# Patient Record
Sex: Female | Born: 1975 | Race: White | Hispanic: No | Marital: Married | Smoking: Never smoker
Health system: Southern US, Community
[De-identification: ages and names within clinical notes are randomized; demographics above are authoritative.]

## PROBLEM LIST (undated history)

## (undated) DIAGNOSIS — K589 Irritable bowel syndrome without diarrhea: Secondary | ICD-10-CM

## (undated) DIAGNOSIS — M1990 Unspecified osteoarthritis, unspecified site: Secondary | ICD-10-CM

## (undated) DIAGNOSIS — K219 Gastro-esophageal reflux disease without esophagitis: Secondary | ICD-10-CM

## (undated) HISTORY — PX: ABDOMINAL HYSTERECTOMY: SHX81

## (undated) HISTORY — PX: TUBAL LIGATION: SHX77

## (undated) HISTORY — PX: PARTIAL HYSTERECTOMY: SHX80

---

## 2004-12-20 ENCOUNTER — Ambulatory Visit: Payer: Self-pay | Admitting: Obstetrics and Gynecology

## 2005-02-03 ENCOUNTER — Inpatient Hospital Stay: Payer: Self-pay

## 2006-11-19 ENCOUNTER — Observation Stay: Payer: Self-pay

## 2006-11-19 ENCOUNTER — Inpatient Hospital Stay: Payer: Self-pay | Admitting: Obstetrics and Gynecology

## 2007-04-04 ENCOUNTER — Ambulatory Visit: Payer: Self-pay

## 2007-07-18 ENCOUNTER — Ambulatory Visit: Payer: Self-pay | Admitting: Internal Medicine

## 2007-08-09 ENCOUNTER — Ambulatory Visit: Payer: Self-pay

## 2007-12-23 ENCOUNTER — Ambulatory Visit: Payer: Self-pay | Admitting: Internal Medicine

## 2008-01-10 ENCOUNTER — Ambulatory Visit: Payer: Self-pay | Admitting: Internal Medicine

## 2009-01-12 ENCOUNTER — Ambulatory Visit: Payer: Self-pay | Admitting: Internal Medicine

## 2009-01-17 ENCOUNTER — Ambulatory Visit: Payer: Self-pay | Admitting: Internal Medicine

## 2009-02-15 ENCOUNTER — Ambulatory Visit: Payer: Self-pay | Admitting: Internal Medicine

## 2009-11-11 ENCOUNTER — Ambulatory Visit: Payer: Self-pay | Admitting: Family Medicine

## 2010-10-22 ENCOUNTER — Ambulatory Visit: Payer: Self-pay | Admitting: Internal Medicine

## 2010-12-08 ENCOUNTER — Ambulatory Visit: Payer: Self-pay | Admitting: Unknown Physician Specialty

## 2010-12-15 ENCOUNTER — Ambulatory Visit: Payer: Self-pay | Admitting: Unknown Physician Specialty

## 2010-12-16 LAB — PATHOLOGY REPORT

## 2011-12-26 ENCOUNTER — Emergency Department: Payer: Self-pay | Admitting: *Deleted

## 2012-04-22 ENCOUNTER — Ambulatory Visit: Payer: Self-pay | Admitting: Emergency Medicine

## 2012-06-11 ENCOUNTER — Ambulatory Visit: Payer: Self-pay | Admitting: Family Medicine

## 2013-01-08 ENCOUNTER — Ambulatory Visit: Payer: Self-pay | Admitting: Family Medicine

## 2015-04-04 ENCOUNTER — Ambulatory Visit
Admission: EM | Admit: 2015-04-04 | Discharge: 2015-04-04 | Disposition: A | Discharge status: Home / Self Care | Payer: BLUE CROSS/BLUE SHIELD | Attending: Internal Medicine | Admitting: Internal Medicine

## 2015-04-04 ENCOUNTER — Ambulatory Visit: Payer: BLUE CROSS/BLUE SHIELD

## 2015-04-04 DIAGNOSIS — R0789 Other chest pain: Secondary | ICD-10-CM | POA: Diagnosis not present

## 2015-04-04 DIAGNOSIS — K219 Gastro-esophageal reflux disease without esophagitis: Secondary | ICD-10-CM | POA: Diagnosis not present

## 2015-04-04 DIAGNOSIS — Z8249 Family history of ischemic heart disease and other diseases of the circulatory system: Secondary | ICD-10-CM

## 2015-04-04 DIAGNOSIS — R42 Dizziness and giddiness: Secondary | ICD-10-CM | POA: Insufficient documentation

## 2015-04-04 DIAGNOSIS — M069 Rheumatoid arthritis, unspecified: Secondary | ICD-10-CM | POA: Diagnosis not present

## 2015-04-04 DIAGNOSIS — R200 Anesthesia of skin: Secondary | ICD-10-CM | POA: Insufficient documentation

## 2015-04-04 DIAGNOSIS — R079 Chest pain, unspecified: Secondary | ICD-10-CM | POA: Insufficient documentation

## 2015-04-04 HISTORY — DX: Gastro-esophageal reflux disease without esophagitis: K21.9

## 2015-04-04 HISTORY — DX: Unspecified osteoarthritis, unspecified site: M19.90

## 2015-04-04 MED ORDER — GI COCKTAIL ~~LOC~~
30.0000 mL | Freq: Once | ORAL | Status: AC
  Administered 2015-04-04: 30 mL via ORAL

## 2015-04-04 MED ORDER — ASPIRIN 81 MG PO CHEW
243.0000 mg | CHEWABLE_TABLET | Freq: Once | ORAL | Status: AC
  Administered 2015-04-04: 243 mg via ORAL

## 2015-04-04 MED ORDER — ISOSORBIDE MONONITRATE ER 30 MG PO TB24: 30.0000 mg | ORAL_TABLET | Freq: Every day | ORAL | Status: DC

## 2015-04-04 MED ORDER — METOPROLOL TARTRATE 25 MG PO TABS: 25.0000 mg | ORAL_TABLET | Freq: Two times a day (BID) | ORAL | Status: DC

## 2015-04-04 NOTE — ED Notes (Signed)
Vital signs stable. 

## 2015-04-04 NOTE — Discharge Instructions (Signed)
Your chest xray was unremarkable today. Your ECGs changed during your time at the urgent care, which can be a sign of a coronary blockage.  The fact that your arm and chest symptoms have subsided for now is encouraging. Prescriptions for metoprolol and isosorbide, medicines that are good for the heart, were sent to [facility].  You should take a dose of each tonight, and again in the morning. Also take a baby aspirin daily, starting tomorrow.  You have had a total of 4 baby aspirins today. Dr Gwen Pounds, a [facility] cardiologist, will see you tomorrow Monday 7/18 at 3:30pm in his [facility] office. Take copies of ECGs with you to the appointment.  Please call the [facility] office/answering service at [phone] with questions in the meantime. If chest/arm symptoms return and last longer than about 5 minutes, particularly if severe, you need to go to the emergency room.

## 2015-04-04 NOTE — ED Provider Notes (Signed)
CSN: [id number]     Arrival date & time 04/04/15  1145 History   First MD Initiated Contact with Patient 04/04/15 1318     Chief Complaint  Patient presents with  . Dizziness  . Numbness   HPI   Patient is a 39 year old lady with past medical history notable for rheumatoid arthritis, followed by Dr. Lavenia Atlas.  She presents today with about a 10 day history of intermittent dizziness which she describes as episodes of swimmy headedness the last for about a minute. In the last couple of days she's had intermittent episodes of sharp chest pain and tingling discomfort in her left arm and hand. These are sometimes, but not exclusively, exertional.  Symptoms are most likely to occur while she is at work, and her work is physical. 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 She went to the [facility] ER with symptoms last evening, and waited in the waiting room for several hours. Family history is notable for strokes in her father and sister, and heart disease in her sister. Sister is age 64 and recently had a heart attack; reported findings at cardiac cath were that one vessel was occluded but too small for stenting.  The sister's stroke and heart attack occurred close in time to each other.  Father had a stroke at age 56.  Past Medical History  Diagnosis Date  . GERD (gastroesophageal reflux disease)   . Arthritis, rheumatoid      Past Surgical History  Procedure Laterality Date  . Partial hysterectomy, still has ovaries.     History reviewed. Family history positive for early onset cardiovascular disease (stroke, heart attack, in father and sister) History  Substance Use Topics  . Smoking status: Never Smoker   . Smokeless tobacco: Never Used  . Alcohol Use: No    Review of Systems  Allergies  Penicillins  Home Medications   Prior to Admission medications   Medication Sig Start Date End Date Taking? Authorizing Provider  folic acid (FOLVITE) 1  MG tablet Take 1 mg by mouth daily.   Yes Historical Provider, MD  methotrexate (RHEUMATREX) 2.5 MG tablet Take 2.5 mg by mouth 3 (three) times a week.   Yes Historical Provider, MD  ranitidine (ZANTAC) 150 MG tablet Take 150 mg by mouth 2 (two) times daily.   Yes Historical Provider, MD                 BP 110/68 mmHg  Pulse 67  Ht 5' (1.524 m)  Wt 115 lb (52.164 kg)  BMI 22.46 kg/m2  SpO2 100% Physical Exam  Constitutional: She is oriented to person, place, and time. No distress.  Alert, nicely groomed  HENT:  Head: Atraumatic.  Eyes:  Conjugate gaze, no eye redness/drainage  Neck: Neck supple.  Cardiovascular: Normal rate and regular rhythm.   Pulmonary/Chest: No respiratory distress. She has no wheezes. She has no rales.  Lungs clear, symmetric breath sounds  Abdominal: She exhibits no distension. There is no tenderness. There is no rebound and no guarding.  Musculoskeletal: Normal range of motion.  No leg swelling  Neurological: She is alert and oriented to person, place, and time.  Skin: Skin is warm and dry.  No cyanosis  Nursing note and vitals reviewed.   ED Course  Procedures  Labs Review  Reviewed [facility] Epic labs for patient, done recently by Dr Lavenia Atlas.  Blood counts, renal and liver functions unremarkable.  Did not repeat labs today.  ECG#1 at urgent care:  sinus rhythm, rate 74, qrs duration 88 msec, qrs axis 51, nonspecific T wave abnormality, biphasic/inverted T waves in V3-V6.  ECG#2 at urgent care (about 60-90 minutes later):   Sinus rhythm, rate 62, qrs duration 90 msec, qrs axis 58, upgoing T waves now in V3-V6.    Imaging Review Dg Chest 2 View  04/04/2015   CLINICAL DATA:  Intermittent chest pain and numbness in the left arm and hand. Dizziness for 2 days. Some nausea yesterday.  EXAM: CHEST  2 VIEW  COMPARISON:  None.  FINDINGS: The heart size and mediastinal contours are within normal limits. Both lungs are clear. The visualized skeletal  structures are unremarkable.  IMPRESSION: No active cardiopulmonary disease.   Electronically Signed   By: Norva Pavlov M.D.   On: 04/04/2015 13:02   Patient pain free while at urgent care. Discussed situation with Dr Kowalski/cardiology, who will see her in followup tomorrow at [facility] [address] office at 3:30pm.  Pt to go to ED for recurrent/severe chest/arm pain lasting longer than about 5 minutes.  MDM   1. Chest pain of uncertain etiology   2. Family history of premature coronary artery disease   Rx lopressor, isosorbide sent to [facility].  Pt to take aspirin 81mg  daily, and followup as above.    , MD 04/04/15 1728

## 2015-04-04 NOTE — ED Notes (Signed)
Patient denies pain and is resting comfortably.  

## 2015-04-04 NOTE — ED Notes (Signed)
Patient with dizziness for that past week. Then developed left arm numbness at around 600pm last night. No other symptoms associated. Numbness still persists.

## 2015-04-11 ENCOUNTER — Emergency Department
Admission: EM | Admit: 2015-04-11 | Discharge: 2015-04-11 | Disposition: A | Discharge status: Home / Self Care | Payer: BLUE CROSS/BLUE SHIELD | Attending: Emergency Medicine | Admitting: Emergency Medicine

## 2015-04-11 ENCOUNTER — Encounter: Payer: Self-pay | Admitting: *Deleted

## 2015-04-11 ENCOUNTER — Emergency Department: Payer: BLUE CROSS/BLUE SHIELD

## 2015-04-11 ENCOUNTER — Other Ambulatory Visit: Payer: Self-pay

## 2015-04-11 DIAGNOSIS — Z7982 Long term (current) use of aspirin: Secondary | ICD-10-CM | POA: Diagnosis not present

## 2015-04-11 DIAGNOSIS — Z880 Allergy status to penicillin: Secondary | ICD-10-CM | POA: Insufficient documentation

## 2015-04-11 DIAGNOSIS — Z79899 Other long term (current) drug therapy: Secondary | ICD-10-CM | POA: Insufficient documentation

## 2015-04-11 DIAGNOSIS — R079 Chest pain, unspecified: Secondary | ICD-10-CM | POA: Diagnosis present

## 2015-04-11 LAB — CBC
HEMATOCRIT: 40.6 % (ref 35.0–47.0)
Hemoglobin: 13.4 g/dL (ref 12.0–16.0)
MCH: 31.1 pg (ref 26.0–34.0)
MCHC: 33.1 g/dL (ref 32.0–36.0)
MCV: 93.9 fL (ref 80.0–100.0)
Platelets: 236 10*3/uL (ref 150–440)
RBC: 4.32 MIL/uL (ref 3.80–5.20)
RDW: 14.1 % (ref 11.5–14.5)
WBC: 7.3 10*3/uL (ref 3.6–11.0)

## 2015-04-11 LAB — BASIC METABOLIC PANEL
Anion gap: 7 (ref 5–15)
BUN: 10 mg/dL (ref 6–20)
CALCIUM: 9 mg/dL (ref 8.9–10.3)
CO2: 28 mmol/L (ref 22–32)
Chloride: 106 mmol/L (ref 101–111)
Creatinine, Ser: 0.73 mg/dL (ref 0.44–1.00)
GFR calc Af Amer: >60 mL/min (ref >60)
Glucose, Bld: 117 mg/dL — ABNORMAL HIGH (ref 65–99)
Potassium: 3.6 mmol/L (ref 3.5–5.1)
SODIUM: 141 mmol/L (ref 135–145)

## 2015-04-11 LAB — TROPONIN I
Troponin I: <0.03 ng/mL (ref <0.031)
Troponin I: <0.03 ng/mL (ref <0.031)

## 2015-04-11 LAB — FIBRIN DERIVATIVES D-DIMER (ARMC ONLY): Fibrin derivatives D-dimer (ARMC): 441 (ref 0–499)

## 2015-04-11 MED ORDER — SODIUM CHLORIDE 0.9 % IV BOLUS (SEPSIS)
1000.0000 mL | Freq: Once | INTRAVENOUS | Status: AC
  Administered 2015-04-11: 1000 mL via INTRAVENOUS

## 2015-04-11 NOTE — Discharge Instructions (Signed)
No certain cause was found for your chest discomfort, however your exam and evaluation are reassuring. No exertional activity until your seen in follow-up by Dr. Gwen Pounds. Call tomorrow to find out if he wants to see Monday or Tuesday, and keep your appointment for Wednesday for the scheduled stress test. Return to the emergency room for any worsening condition including chest pain, dizziness, passing out, shortness of breath, fever, or any other symptoms concerning to.  Your blood pressure was mildly pneumonia came in and you're given a pack of IV fluids. Make sure that she were to keep plenty fluids as they don't dehydrated.   Chest Pain (Nonspecific) It is often hard to give a diagnosis for the cause of chest pain. There is always a chance that your pain could be related to something serious, such as a heart attack or a blood clot in the lungs. You need to follow up with your doctor. HOME CARE  If antibiotic medicine was given, take it as directed by your doctor. Finish the medicine even if you start to feel better.  For the next few days, avoid activities that bring on chest pain. Continue physical activities as told by your doctor.  Do not use any tobacco products. This includes cigarettes, chewing tobacco, and e-cigarettes.  Avoid drinking alcohol.  Only take medicine as told by your doctor.  Follow your doctor's suggestions for more testing if your chest pain does not go away.  Keep all doctor visits you made. GET HELP IF:  Your chest pain does not go away, even after treatment.  You have a rash with blisters on your chest.  You have a fever. GET HELP RIGHT AWAY IF:   You have more pain or pain that spreads to your arm, neck, jaw, back, or belly (abdomen).  You have shortness of breath.  You cough more than usual or cough up blood.  You have very bad back or belly pain.  You feel sick to your stomach (nauseous) or throw up (vomit).  You have very bad weakness.  You  pass out (faint).  You have chills. This is an emergency. Do not wait to see if the problems will go away. Call your local emergency services (911 in [place] Do not drive yourself to the hospital. MAKE SURE YOU:   Understand these instructions.  Will watch your condition.  Will get help right away if you are not doing well or get worse. Document Released: 02/21/2008 Document Revised: 09/09/2013 Document Reviewed: 02/21/2008 [facility] Patient Information 2015 [place], [place]. This information is not intended to replace advice given to you by your health care provider. Make sure you discuss any questions you have with your health care provider.

## 2015-04-11 NOTE — ED Provider Notes (Addendum)
[facility] Emergency Department Provider Note   ____________________________________________  Time seen: 12:30 PM I have reviewed the triage vital signs and the triage nursing note.  HISTORY  Chief Complaint Chest Pain   Historian Patient and her spouse  HPI Amy Kirby is a 39 y.o. female is complaining of an episode of chest pressure that felt like an elephant was on her chest and extended into numbness of the left arm without any weakness. Started around 10 AM. She felt little nauseated. She did have some right hand numbness as well. She was recently seen at urgent care for sore chest discomfort episode and referred to cardiologist and saw Dr. Gwen Pounds last week. She had a Holter monitor and turned in on Tuesday. She has not heard about the results. She has a stress test scheduled for this coming Wednesday. No shortness of breath. No fevers. No coughing. No indigestion or GI symptoms. Patient states she is under the "normal amount of stress."    Past Medical History  Diagnosis Date  . GERD (gastroesophageal reflux disease)   . Arthritis     There are no active problems to display for this patient.   Past Surgical History  Procedure Laterality Date  . Partial hysterectomy      Current Outpatient Rx  Name  Route  Sig  Dispense  Refill  . aspirin EC 81 MG tablet   Oral   Take 81 mg by mouth daily.         . Cyanocobalamin (VITAMIN B-12 PO)   Oral   Take 1 tablet by mouth daily.         . folic acid (FOLVITE) 1 MG tablet   Oral   Take 1 mg by mouth daily.         . isosorbide mononitrate (IMDUR) 30 MG 24 hr tablet   Oral   Take 1 tablet (30 mg total) by mouth daily.   5 tablet   0   . methotrexate (RHEUMATREX) 2.5 MG tablet   Oral   Take 15 mg by mouth once a week. Take on Wednesday.         . metoprolol (LOPRESSOR) 25 MG tablet   Oral   Take 1 tablet (25 mg total) by mouth 2 (two) times daily.   10 tablet   0   .  ranitidine (ZANTAC) 150 MG tablet   Oral   Take 150 mg by mouth daily.            Allergies Penicillins  History reviewed. No pertinent family history.  Father with a stroke and MI, in his mid 10s Sister with a stroke and "minor heart attack ", in her 30s.  Social History History  Substance Use Topics  . Smoking status: Never Smoker   . Smokeless tobacco: Never Used  . Alcohol Use: No    Review of Systems  Constitutional: Negative for fever. Eyes: Negative for visual changes. ENT: Negative for sore throat. Cardiovascular: See history of present illness Respiratory: Negative for shortness of breath. Gastrointestinal: Negative for abdominal pain, vomiting and diarrhea. Genitourinary: Negative for dysuria. Musculoskeletal: Negative for back pain. Skin: Negative for rash. Neurological: Negative for headaches, focal weakness or numbness. 10 point Review of Systems otherwise negative ____________________________________________   PHYSICAL EXAM:  VITAL SIGNS: ED Triage Vitals  Enc Vitals Group     BP 04/11/15 1218 96/62 mmHg     Pulse Rate 04/11/15 1217 92     Resp --  Temp 04/11/15 1217 98.7 F (37.1 C)     Temp Source 04/11/15 1217 Oral     SpO2 04/11/15 1218 99 %     Weight 04/11/15 1218 105 lb (47.628 kg)     Height 04/11/15 1218 5\' 5"  (1.651 m)     Head Cir --      Peak Flow --      Pain Score 04/11/15 1218 4     Pain Loc --      Pain Edu? --      Excl. in GC? --      Constitutional: Alert and oriented. Appears somewhat anxious or uncomfortable. In no distress. Eyes: Conjunctivae are normal. PERRL. Normal extraocular movements. ENT   Head: Normocephalic and atraumatic.   Nose: No congestion/rhinnorhea.   Mouth/Throat: Mucous membranes are moist.   Neck: No stridor. Cardiovascular/Chest: Normal rate, regular rhythm.  No murmurs, rubs, or gallops. Respiratory: Normal respiratory effort without tachypnea nor retractions. Breath sounds  are clear and equal bilaterally. No wheezes/rales/rhonchi. Gastrointestinal: Soft. No distention, no guarding, no rebound. Nontender . Thin  Genitourinary/rectal:Deferred Musculoskeletal: Nontender with normal range of motion in all extremities. No joint effusions.  No lower extremity tenderness nor edema. Neurologic:  Normal speech and language. No gross or focal neurologic deficits are appreciated. Skin:  Skin is warm, dry and intact. No rash noted. Psychiatric: Mood and affect are normal. Speech and behavior are normal. Patient exhibits appropriate insight and judgment.  ____________________________________________   EKG I, 04/13/15, MD, the attending physician have personally viewed and interpreted all ECGs.  Normal sinus rhythm. 85 bpm. Normal axis. Narrow QRS. Normal ST and T-wave. ____________________________________________  LABS (pertinent positives/negatives)  Metabolic panel without significant abnormality CBC within normal limits Troponin less than 0.03  ____________________________________________  RADIOLOGY All Xrays were viewed by me. Imaging interpreted by Radiologist.  Chest x-ray portable: Normal exam __________________________________________  PROCEDURES  Procedure(s) performed: None Critical Care performed: None  ____________________________________________   ED COURSE / ASSESSMENT AND PLAN  CONSULTATIONS: None  Pertinent labs & imaging results that were available during my care of the patient were reviewed by me and considered in my medical decision making (see chart for details).  Patient episode that lasted about 2 hours of an atypical chest pain without certain etiology, however her exam and evaluation are reassuring. I discussed with her checking a repeat troponin about 3 hours. If this is reassuring she can be discharged to follow-up with the cardiologist and stress test on Wednesday as scheduled.  After 1 L normal saline patient's blood  pressures 97/50. Patient is not having any significant chest pain but she does say that she gets a sharp chest pain 20 left side every once a while. She is low risk for PE and I not suspicious of this diagnosis, however I will evaluate with a d-dimer.   Patient care transferred to Dr. Thursday at shift change, 3 PM. Repeat Troponin pending. D-dimer pending   Patient / Family / Caregiver informed of clinical course, medical decision-making process, and agree with plan.   I discussed return precautions, follow-up instructions, and discharged instructions with patient and/or family.  ___________________________________________   FINAL CLINICAL IMPRESSION(S) / ED DIAGNOSES   Final diagnoses:  Chest pain, unspecified chest pain type    FOLLOW UP  Referred to: Dr. Fanny Bien, MD 04/11/15 1400  04/13/15, MD 04/11/15 [phone]

## 2015-04-11 NOTE — ED Provider Notes (Signed)
Troponin negative 2. Vital signs stable. D-dimer negative. Patient awake and alert. Provided discharge instructions and follow-up care plus return precautions.  Sharyn Creamer, MD 04/11/15 [phone]

## 2015-04-11 NOTE — ED Notes (Signed)
Pt at Urgent Care 1 week ago for CP, referred to [facility], had 24 hr halter monitor on.  Pt has not heard back from cardio, c/o chest pressure today, EMS gave 2 SL nitro sprays, pt took 81 mg ASA, EMS gave 243 mg more.   Per EMS, pt was hyperventilating upon their arrival, pt has since calmed down.  States 4/10 chest pressure at this time.  NSR on the monitor.

## 2015-09-13 ENCOUNTER — Ambulatory Visit
Admission: EM | Admit: 2015-09-13 | Discharge: 2015-09-13 | Disposition: A | Discharge status: Home / Self Care | Payer: BLUE CROSS/BLUE SHIELD | Attending: Family Medicine | Admitting: Family Medicine

## 2015-09-13 ENCOUNTER — Encounter: Payer: Self-pay | Admitting: Emergency Medicine

## 2015-09-13 DIAGNOSIS — A499 Bacterial infection, unspecified: Secondary | ICD-10-CM | POA: Diagnosis not present

## 2015-09-13 DIAGNOSIS — N760 Acute vaginitis: Secondary | ICD-10-CM | POA: Diagnosis not present

## 2015-09-13 DIAGNOSIS — N762 Acute vulvitis: Secondary | ICD-10-CM

## 2015-09-13 DIAGNOSIS — B9689 Other specified bacterial agents as the cause of diseases classified elsewhere: Secondary | ICD-10-CM

## 2015-09-13 LAB — URINALYSIS COMPLETE WITH MICROSCOPIC (ARMC ONLY)
BACTERIA UA: NONE SEEN
Bilirubin Urine: NEGATIVE
GLUCOSE, UA: NEGATIVE mg/dL
Ketones, ur: NEGATIVE mg/dL
Nitrite: NEGATIVE
PROTEIN: NEGATIVE mg/dL
Specific Gravity, Urine: 1.02 (ref 1.005–1.030)
pH: 6.5 (ref 5.0–8.0)

## 2015-09-13 LAB — WET PREP, GENITAL
SPERM: NONE SEEN
TRICH WET PREP: NONE SEEN
WBC WET PREP: NONE SEEN
YEAST WET PREP: NONE SEEN

## 2015-09-13 MED ORDER — SULFAMETHOXAZOLE-TRIMETHOPRIM 800-160 MG PO TABS: 1.0000 | ORAL_TABLET | Freq: Two times a day (BID) | ORAL | Status: DC

## 2015-09-13 MED ORDER — METRONIDAZOLE 500 MG PO TABS
500.0000 mg | ORAL_TABLET | Freq: Two times a day (BID) | ORAL | Status: DC
Start: 2015-09-13 — End: 2015-09-16

## 2015-09-13 NOTE — ED Notes (Signed)
Patient c/o burning when urinating for 4-5 days.

## 2015-09-13 NOTE — Discharge Instructions (Signed)
Take medication as prescribed. Use sits baths. Practice pelvic rest and no sexual activity until completion medication and follow-up.  Follow-up closely with your primary care physician this week. Return to urgent care as needed for fever, increased pain, increased swelling, drainage, new or worsening concerns.  Bacterial Vaginosis Bacterial vaginosis is a vaginal infection that occurs when the normal balance of bacteria in the vagina is disrupted. It results from an overgrowth of certain bacteria. This is the most common vaginal infection in women of childbearing age. Treatment is important to prevent complications, especially in pregnant women, as it can cause a premature delivery. CAUSES  Bacterial vaginosis is caused by an increase in harmful bacteria that are normally present in smaller amounts in the vagina. Several different kinds of bacteria can cause bacterial vaginosis. However, the reason that the condition develops is not fully understood. RISK FACTORS Certain activities or behaviors can put you at an increased risk of developing bacterial vaginosis, including:  Having a new sex partner or multiple sex partners.  Douching.  Using an intrauterine device (IUD) for contraception. Women do not get bacterial vaginosis from toilet seats, bedding, swimming pools, or contact with objects around them. SIGNS AND SYMPTOMS  Some women with bacterial vaginosis have no signs or symptoms. Common symptoms include:  Grey vaginal discharge.  A fishlike odor with discharge, especially after sexual intercourse.  Itching or burning of the vagina and vulva.  Burning or pain with urination. DIAGNOSIS  Your health care provider will take a medical history and examine the vagina for signs of bacterial vaginosis. A sample of vaginal fluid may be taken. Your health care provider will look at this sample under a microscope to check for bacteria and abnormal cells. A vaginal pH test may also be done.    TREATMENT  Bacterial vaginosis may be treated with antibiotic medicines. These may be given in the form of a pill or a vaginal cream. A second round of antibiotics may be prescribed if the condition comes back after treatment. Because bacterial vaginosis increases your risk for sexually transmitted diseases, getting treated can help reduce your risk for chlamydia, gonorrhea, HIV, and herpes. HOME CARE INSTRUCTIONS   Only take over-the-counter or prescription medicines as directed by your health care provider.  If antibiotic medicine was prescribed, take it as directed. Make sure you finish it even if you start to feel better.  Tell all sexual partners that you have a vaginal infection. They should see their health care provider and be treated if they have problems, such as a mild rash or itching.  During treatment, it is important that you follow these instructions:  Avoid sexual activity or use condoms correctly.  Do not douche.  Avoid alcohol as directed by your health care provider.  Avoid breastfeeding as directed by your health care provider. SEEK MEDICAL CARE IF:   Your symptoms are not improving after 3 days of treatment.  You have increased discharge or pain.  You have a fever. MAKE SURE YOU:   Understand these instructions.  Will watch your condition.  Will get help right away if you are not doing well or get worse. FOR MORE INFORMATION  Centers for Disease Control and Prevention, Division of STD Prevention: SolutionApps.[employer].za American Sexual Health Association (ASHA): www.ashastd.org    This information is not intended to replace advice given to you by your health care provider. Make sure you discuss any questions you have with your health care provider.   Document Released: 09/04/2005 Document Revised:  09/25/2014 Document Reviewed: 04/16/2013 Elsevier Interactive Patient Education [employer].

## 2015-09-13 NOTE — ED Provider Notes (Signed)
[facility]  ____________________________________________  Time seen: Approximately 1:20PM  I have reviewed the triage vital signs and the nursing notes.   HISTORY  Chief Complaint Dysuria   HPI Amy Kirby is a 39 y.o. female presents for the complaint of burning with urination for 4-5 days. Patient reports that the burning is not only present with urination but seems to be at all times as well. Patient states that the left side of her vagina seems to burn. Patient also reports that the last 2-3 days some intermittent vaginal discharge. States tends to be a whitish to yellowish color. Denies abdominal pain or fever. Denies urinary frequency or urgency. Patient reports that when she looks at the left labia it looks to be red and swollen. Patient states that over the last 2 days that same area where she said is red and swollen seems more irritated as it has been rubbing against her pants and seems to open the skin.  Denies other rash or lesions. Denies history of similar in the past.   patient reports that she is sexually active with 1 partner. Patient does report that her and her husband were separated for 3 months, and  reports that he did have sexual relations with another partner. Reports that they've been reconciled for the last 3 weeks. Denies concerns for sexual transmitted diseases.  Denies vaginal pain, abdominal pain, back pain, urinary frequency, urinary urgency, bowel changes. Reports partial hysterectomy. Denies any history of cold sores herpes or sexually transmitted diseases.      Past Medical History  Diagnosis Date  . GERD (gastroesophageal reflux disease)   . Arthritis     There are no active problems to display for this patient.   Past Surgical History  Procedure Laterality Date  . Partial hysterectomy    . Abdominal hysterectomy    . Tubal ligation      Current Outpatient Rx  Name  Route  Sig  Dispense  Refill  . aspirin EC 81 MG tablet  Oral   Take 81 mg by mouth daily.         . Cyanocobalamin (VITAMIN B-12 PO)   Oral   Take 1 tablet by mouth daily.         . folic acid (FOLVITE) 1 MG tablet   Oral   Take 1 mg by mouth daily.         . isosorbide mononitrate (IMDUR) 30 MG 24 hr tablet   Oral   Take 1 tablet (30 mg total) by mouth daily.   5 tablet   0   . metoprolol (LOPRESSOR) 25 MG tablet   Oral   Take 1 tablet (25 mg total) by mouth 2 (two) times daily.   10 tablet   0   .           . ranitidine (ZANTAC) 150 MG tablet   Oral   Take 150 mg by mouth daily.          .             Allergies Penicillins  History reviewed. No pertinent family history.  Social History Social History  Substance Use Topics  . Smoking status: Never Smoker   . Smokeless tobacco: Never Used  . Alcohol Use: No    Review of Systems Constitutional: No fever/chills Eyes: No visual changes. ENT: No sore throat. Cardiovascular: Denies chest pain. Respiratory: Denies shortness of breath. Gastrointestinal: No abdominal pain.  No nausea, no vomiting.  No  diarrhea.  No constipation. Genitourinary: Vaginal burning Musculoskeletal: Negative for back pain. Skin: Negative for rash. Neurological: Negative for headaches, focal weakness or numbness.  10-point ROS otherwise negative.  ____________________________________________   PHYSICAL EXAM:  VITAL SIGNS: ED Triage Vitals  Enc Vitals Group     BP 09/13/15 1214 96/51 mmHg     Pulse Rate 09/13/15 1214 85     Resp 09/13/15 1214 16     Temp 09/13/15 1214 98.9 F (37.2 C)     Temp Source 09/13/15 1214 Tympanic     SpO2 09/13/15 1214 100 %     Weight 09/13/15 1214 115 lb (52.164 kg)     Height 09/13/15 1214 5\' 5"  (1.651 m)     Head Cir --      Peak Flow --      Pain Score 09/13/15 1218 6     Pain Loc --      Pain Edu? --      Excl. in GC? --     Constitutional: Alert and oriented. Well appearing and in no acute distress. Eyes: Conjunctivae are normal.  PERRL. EOMI. Head: Atraumatic.  Nose: No congestion/rhinnorhea.  Mouth/Throat: Mucous membranes are moist.  Oropharynx non-erythematous. Neck: No stridor.  No cervical spine tenderness to palpation. Hematological/Lymphatic/Immunilogical: No cervical lymphadenopathy. Cardiovascular: Normal rate, regular rhythm. Grossly normal heart sounds.  Good peripheral circulation. Respiratory: Normal respiratory effort.  No retractions. Lungs CTAB. Gastrointestinal: Soft and nontender. No distention. Normal Bowel sounds.  No abdominal bruits. No CVA tenderness. Pelvic : Exam completed with [name] RN at bedside.  External: Left labia mild to moderate erythema with mild swelling, no induration, no fluctuance , no pointing abscess, superficial excoriated appearing lesions present , no ulcerations no exudative drainage,  No palpated abscess, mild tenderness to palpation, no other rash or lesion noted. Speculum: Mild active whitish discharge, cervix surgically absent. Bimanual: Nontender, cervix surgically absent, no adnexal tenderness.   Musculoskeletal: No lower or upper extremity tenderness nor edema.  No joint effusions. Bilateral pedal pulses equal and easily palpated.  Neurologic:  Normal speech and language. No gross focal neurologic deficits are appreciated. No gait instability. Skin:  Skin is warm, dry and intact. No rash noted. Psychiatric: Mood and affect are normal. Speech and behavior are normal.  ____________________________________________   LABS (all labs ordered are listed, but only abnormal results are displayed)  Labs Reviewed  WET PREP, GENITAL - Abnormal; Notable for the following:    Clue Cells Wet Prep HPF POC FEW (*)    All other components within normal limits  URINALYSIS COMPLETEWITH MICROSCOPIC ([facility] ONLY) - Abnormal; Notable for the following:    Hgb urine dipstick TRACE (*)    Leukocytes, UA TRACE (*)    Squamous Epithelial / LPF 6-30 (*)    All other components within  normal limits  URINE CULTURE  CHLAMYDIA/NGC RT PCR ([facility] ONLY)  HSV CULTURE AND TYPING  HSV(HERPES SIMPLEX VRS) I + II AB-IGG  HSV(HERPES SIMPLEX VRS) I + II AB-IGM    INITIAL IMPRESSION / ASSESSMENT AND PLAN / ED COURSE  Pertinent labs & imaging results that were available during my care of the patient were reviewed by me and considered in my medical decision making (see chart for details).  Very well-appearing patient. No acute distress. Presents for the complaints of vaginal burning and tenderness. Patient reports that this is gradual onset over the last 4-5 days with accompanying whitish vaginal discharge. Denies fevers, urinary changes or bowel changes. Denies abdominal pain.  Urinalysis negative for bacteria. Will culture.  With RN [name] at bedside pelvic exam was completed. Patient left labia with mild to moderate erythema and mild swelling without focal abscess, induration or fluctuance. Left external labia with slightly excoriated appearing superficial lesions noted in which patient states that this was due to rubbing against her jeans. Discussed with patient will evaluate for herpes. Herpes swab as well as labs obtained. Patient states that she was okay with testing for gonorrhea and chlamydia but states that she does not want other STD testing. Counseled regarding close follow-up with primary care physician as well as evaluation for STDs as patient again reports that she does not want testing for any other STDs at this time.  Wet prep also positive for few clue cells. Will treat patient Bactrim vaginosis with oral Flagyl as well as due to left labial erythema concerned for beginning cellulitis and will treat with oral Bactrim. Counseled patient regarding to call back to urgent care to follow-up with herpes swab and lab testing. Encouraged close follow-up.  Discussed follow up with Primary care physician this week. Discussed follow up and return parameters including no  resolution or any worsening concerns. Patient verbalized understanding and agreed to plan.   ____________________________________________   FINAL CLINICAL IMPRESSION(S) / ED DIAGNOSES  Final diagnoses:  Bacterial vaginitis  Cellulitis of labia       Renford Dills, NP 09/16/15 1209

## 2015-09-14 LAB — CHLAMYDIA/NGC RT PCR (ARMC ONLY)
Chlamydia Tr: NOT DETECTED
N gonorrhoeae: NOT DETECTED

## 2015-09-15 LAB — HSV(HERPES SIMPLEX VRS) I + II AB-IGG: HSV 1 Glycoprotein G Ab, IgG: <0.91 index (ref 0.00–0.90)

## 2015-09-15 LAB — URINE CULTURE: Special Requests: NORMAL

## 2015-09-15 LAB — HSV(HERPES SIMPLEX VRS) I + II AB-IGM: HSVI/II Comb IgM: <0.91 Ratio (ref 0.00–0.90)

## 2015-09-16 ENCOUNTER — Encounter: Payer: Self-pay | Admitting: *Deleted

## 2015-09-16 ENCOUNTER — Inpatient Hospital Stay
Admission: EM | Admit: 2015-09-16 | Discharge: 2015-09-18 | DRG: 759 | Disposition: A | Discharge status: Home / Self Care | Payer: BLUE CROSS/BLUE SHIELD | Attending: Obstetrics and Gynecology | Admitting: Obstetrics and Gynecology

## 2015-09-16 ENCOUNTER — Other Ambulatory Visit: Payer: Self-pay | Admitting: Obstetrics and Gynecology

## 2015-09-16 DIAGNOSIS — R112 Nausea with vomiting, unspecified: Secondary | ICD-10-CM | POA: Diagnosis present

## 2015-09-16 DIAGNOSIS — N762 Acute vulvitis: Principal | ICD-10-CM | POA: Diagnosis present

## 2015-09-16 DIAGNOSIS — R519 Headache, unspecified: Secondary | ICD-10-CM

## 2015-09-16 DIAGNOSIS — R509 Fever, unspecified: Secondary | ICD-10-CM | POA: Diagnosis present

## 2015-09-16 DIAGNOSIS — K219 Gastro-esophageal reflux disease without esophagitis: Secondary | ICD-10-CM | POA: Diagnosis present

## 2015-09-16 DIAGNOSIS — Z880 Allergy status to penicillin: Secondary | ICD-10-CM

## 2015-09-16 DIAGNOSIS — L0390 Cellulitis, unspecified: Secondary | ICD-10-CM | POA: Diagnosis present

## 2015-09-16 DIAGNOSIS — R51 Headache: Secondary | ICD-10-CM | POA: Diagnosis present

## 2015-09-16 DIAGNOSIS — Z79899 Other long term (current) drug therapy: Secondary | ICD-10-CM

## 2015-09-16 DIAGNOSIS — Z7982 Long term (current) use of aspirin: Secondary | ICD-10-CM | POA: Diagnosis not present

## 2015-09-16 DIAGNOSIS — L03818 Cellulitis of other sites: Secondary | ICD-10-CM

## 2015-09-16 DIAGNOSIS — B9689 Other specified bacterial agents as the cause of diseases classified elsewhere: Secondary | ICD-10-CM

## 2015-09-16 DIAGNOSIS — N760 Acute vaginitis: Secondary | ICD-10-CM

## 2015-09-16 LAB — URINALYSIS COMPLETE WITH MICROSCOPIC (ARMC ONLY)
BILIRUBIN URINE: NEGATIVE
Bacteria, UA: NONE SEEN
Glucose, UA: NEGATIVE mg/dL
Hgb urine dipstick: NEGATIVE
NITRITE: NEGATIVE
PROTEIN: 30 mg/dL — AB
RBC / HPF: NONE SEEN RBC/hpf (ref 0–5)
Specific Gravity, Urine: 1.031 — ABNORMAL HIGH (ref 1.005–1.030)
pH: 5 (ref 5.0–8.0)

## 2015-09-16 LAB — COMPREHENSIVE METABOLIC PANEL
ALBUMIN: 4 g/dL (ref 3.5–5.0)
ALT: 16 U/L (ref 14–54)
ANION GAP: 9 (ref 5–15)
AST: 18 U/L (ref 15–41)
Alkaline Phosphatase: 77 U/L (ref 38–126)
BILIRUBIN TOTAL: 0.9 mg/dL (ref 0.3–1.2)
BUN: 13 mg/dL (ref 6–20)
CO2: 25 mmol/L (ref 22–32)
Calcium: 9 mg/dL (ref 8.9–10.3)
Chloride: 101 mmol/L (ref 101–111)
Creatinine, Ser: 0.63 mg/dL (ref 0.44–1.00)
GFR calc Af Amer: >60 mL/min (ref >60)
GFR calc non Af Amer: >60 mL/min (ref >60)
Glucose, Bld: 108 mg/dL — ABNORMAL HIGH (ref 65–99)
POTASSIUM: 4 mmol/L (ref 3.5–5.1)
Sodium: 135 mmol/L (ref 135–145)
TOTAL PROTEIN: 6.7 g/dL (ref 6.5–8.1)

## 2015-09-16 LAB — HSV CULTURE AND TYPING

## 2015-09-16 LAB — CBC WITH DIFFERENTIAL/PLATELET
BASOS PCT: 0 %
Basophils Absolute: 0 10*3/uL (ref 0–0.1)
EOS ABS: 0 10*3/uL (ref 0–0.7)
Eosinophils Relative: 0 %
HEMATOCRIT: 37.7 % (ref 35.0–47.0)
Hemoglobin: 12.8 g/dL (ref 12.0–16.0)
Lymphocytes Relative: 8 %
Lymphs Abs: 0.9 10*3/uL — ABNORMAL LOW (ref 1.0–3.6)
MCH: 31.4 pg (ref 26.0–34.0)
MCHC: 34 g/dL (ref 32.0–36.0)
MCV: 92.3 fL (ref 80.0–100.0)
MONO ABS: 0.4 10*3/uL (ref 0.2–0.9)
Monocytes Relative: 3 %
NEUTROS ABS: 9.3 10*3/uL — AB (ref 1.4–6.5)
Neutrophils Relative %: 89 %
Platelets: 272 10*3/uL (ref 150–440)
RBC: 4.08 MIL/uL (ref 3.80–5.20)
RDW: 13 % (ref 11.5–14.5)
WBC: 10.5 10*3/uL (ref 3.6–11.0)

## 2015-09-16 MED ORDER — ONDANSETRON HCL 4 MG/2ML IJ SOLN
4.0000 mg | Freq: Once | INTRAMUSCULAR | Status: AC
  Administered 2015-09-16: 4 mg via INTRAVENOUS

## 2015-09-16 MED ORDER — SULFAMETHOXAZOLE-TRIMETHOPRIM 800-160 MG PO TABS
1.0000 | ORAL_TABLET | Freq: Once | ORAL | Status: AC
Start: 2015-09-16 — End: 2015-09-16
  Administered 2015-09-16: 1 via ORAL

## 2015-09-16 MED ORDER — METRONIDAZOLE 0.75 % VA GEL: 1.0000 | Freq: Two times a day (BID) | VAGINAL | Status: DC

## 2015-09-16 MED ORDER — ACETAMINOPHEN 325 MG PO TABS
650.0000 mg | ORAL_TABLET | ORAL | Status: DC | PRN
  Administered 2015-09-17 – 2015-09-18 (×3): 650 mg via ORAL
  Filled 2015-09-16 (×3): qty 2

## 2015-09-16 MED ORDER — SODIUM CHLORIDE 0.9 % IV SOLN
INTRAVENOUS | Status: DC
  Administered 2015-09-17 – 2015-09-18 (×3): via INTRAVENOUS

## 2015-09-16 MED ORDER — DIPHENHYDRAMINE HCL 50 MG/ML IJ SOLN: 25.0000 mg | Freq: Once | INTRAMUSCULAR | Status: DC

## 2015-09-16 MED ORDER — METRONIDAZOLE 0.75 % VA GEL
1.0000 | Freq: Two times a day (BID) | VAGINAL | Status: DC
  Administered 2015-09-17 – 2015-09-18 (×3): 1 via VAGINAL
  Filled 2015-09-16: qty 70

## 2015-09-16 MED ORDER — MORPHINE SULFATE (PF) 4 MG/ML IV SOLN
INTRAVENOUS | Status: AC
  Administered 2015-09-16: 4 mg via INTRAVENOUS
  Filled 2015-09-16: qty 1

## 2015-09-16 MED ORDER — MORPHINE SULFATE (PF) 4 MG/ML IV SOLN
4.0000 mg | Freq: Once | INTRAVENOUS | Status: AC
  Administered 2015-09-16: 4 mg via INTRAVENOUS

## 2015-09-16 MED ORDER — DEXTROSE 5 % IV SOLN: 2.0000 g | Freq: Once | INTRAVENOUS | Status: DC

## 2015-09-16 MED ORDER — SODIUM CHLORIDE 0.9 % IV SOLN
Freq: Once | INTRAVENOUS | Status: AC
  Administered 2015-09-16: 12:00:00 via INTRAVENOUS

## 2015-09-16 MED ORDER — SODIUM CHLORIDE 0.9 % IV BOLUS (SEPSIS)
1000.0000 mL | Freq: Once | INTRAVENOUS | Status: AC
  Administered 2015-09-16: 1000 mL via INTRAVENOUS

## 2015-09-16 MED ORDER — SULFAMETHOXAZOLE-TRIMETHOPRIM 800-160 MG PO TABS: 1.0000 | ORAL_TABLET | Freq: Two times a day (BID) | ORAL | Status: DC

## 2015-09-16 MED ORDER — MORPHINE SULFATE (PF) 4 MG/ML IV SOLN
4.0000 mg | Freq: Once | INTRAVENOUS | Status: AC
Start: 2015-09-16 — End: 2015-09-16
  Administered 2015-09-16: 4 mg via INTRAVENOUS

## 2015-09-16 MED ORDER — CEFAZOLIN SODIUM 1-5 GM-% IV SOLN
1.0000 g | Freq: Four times a day (QID) | INTRAVENOUS | Status: DC
  Filled 2015-09-16 (×3): qty 50

## 2015-09-16 MED ORDER — ONDANSETRON HCL 4 MG/2ML IJ SOLN
INTRAMUSCULAR | Status: AC
  Administered 2015-09-16: 4 mg via INTRAVENOUS
  Filled 2015-09-16: qty 2

## 2015-09-16 MED ORDER — SODIUM CHLORIDE 0.9 % IV SOLN
Freq: Once | INTRAVENOUS | Status: AC
Start: 2015-09-16 — End: 2015-09-16
  Administered 2015-09-16: 23:00:00 via INTRAVENOUS

## 2015-09-16 MED ORDER — DIPHENHYDRAMINE HCL 50 MG/ML IJ SOLN
25.0000 mg | Freq: Once | INTRAMUSCULAR | Status: AC
  Administered 2015-09-16: 25 mg via INTRAVENOUS
  Filled 2015-09-16: qty 1

## 2015-09-16 MED ORDER — IBUPROFEN 200 MG PO TABS: 600.0000 mg | ORAL_TABLET | Freq: Four times a day (QID) | ORAL | Status: DC | PRN

## 2015-09-16 MED ORDER — ACETAMINOPHEN 325 MG PO TABS: 650.0000 mg | ORAL_TABLET | Freq: Four times a day (QID) | ORAL | Status: DC | PRN

## 2015-09-16 MED ORDER — CEFAZOLIN SODIUM 1-5 GM-% IV SOLN
1.0000 g | Freq: Once | INTRAVENOUS | Status: AC
  Administered 2015-09-17: 1 g via INTRAVENOUS
  Filled 2015-09-16: qty 50

## 2015-09-16 MED ORDER — MORPHINE SULFATE (PF) 4 MG/ML IV SOLN
4.0000 mg | INTRAVENOUS | Status: DC | PRN
  Administered 2015-09-16: 2 mg via INTRAVENOUS
  Administered 2015-09-17 (×3): 4 mg via INTRAVENOUS
  Filled 2015-09-16 (×4): qty 1

## 2015-09-16 MED ORDER — ONDANSETRON HCL 4 MG/2ML IJ SOLN
4.0000 mg | Freq: Once | INTRAMUSCULAR | Status: AC
  Administered 2015-09-16: 4 mg via INTRAVENOUS
  Filled 2015-09-16: qty 2

## 2015-09-16 MED ORDER — MORPHINE SULFATE (PF) 4 MG/ML IV SOLN: 4.0000 mg | INTRAVENOUS | Status: AC | PRN

## 2015-09-16 MED ORDER — PROMETHAZINE HCL 25 MG/ML IJ SOLN: 25.0000 mg | Freq: Four times a day (QID) | INTRAMUSCULAR | Status: AC | PRN

## 2015-09-16 MED ORDER — VANCOMYCIN HCL IN DEXTROSE 1-5 GM/200ML-% IV SOLN
1000.0000 mg | Freq: Once | INTRAVENOUS | Status: AC
  Administered 2015-09-16: 1000 mg via INTRAVENOUS
  Filled 2015-09-16 (×2): qty 200

## 2015-09-16 MED ORDER — SULFAMETHOXAZOLE-TRIMETHOPRIM 800-160 MG PO TABS
ORAL_TABLET | ORAL | Status: AC
  Administered 2015-09-16: 1 via ORAL
  Filled 2015-09-16: qty 1

## 2015-09-16 MED ORDER — ONDANSETRON 4 MG PO TBDP
4.0000 mg | ORAL_TABLET | Freq: Three times a day (TID) | ORAL | Status: DC | PRN
Start: 2015-09-16 — End: 2015-09-18

## 2015-09-16 MED ORDER — OXYCODONE-ACETAMINOPHEN 5-325 MG PO TABS: 1.0000 | ORAL_TABLET | Freq: Four times a day (QID) | ORAL | Status: DC | PRN

## 2015-09-16 MED ORDER — KETOROLAC TROMETHAMINE 15 MG/ML IJ SOLN: 15.0000 mg | Freq: Four times a day (QID) | INTRAMUSCULAR | Status: DC | PRN

## 2015-09-16 NOTE — Progress Notes (Unsigned)
Consult History and Physical   SERVICE: Gynecology Admit Day  Patient Name: Amy Kirby Patient MRN:   [id number]  CC: C/O "seen in Urgent Care on Monday with Lt labial infection and lymph node enlargement on the left". Treated with 2 antibiotics (Bactrim and Flagyl) per pt. On Tues, began to vomit and have hot sweats and had a Temp of 100.7 on Tues pm. The pt has been climbing a ladder at work at [employer] and feels that her underwear irritated her labial area. Then it began to swell around the lymph nodes on the Left as well. Now, she is having nausea and vomiting and cannot hold the meds or fluids down. Vomiting on exam.   HPI: Amy Kirby is a 39 y.o. No obstetric history on file. Pt had 1 SVD delivered by Dr Feliberto Gottron.    Review of Systems: positives in bold GEN:  + fevers, no  chills, weight changes, appetite changes, fatigue, + night sweats HEENT: + HA, no  vision changes, hearing loss, congestion, rhinorrhea, sinus pressure, dysphagia CV:  No  CP, palpitations PULM:  No SOB, cough GI:  No abd pain, N/V/D/C GU:  + dysuria, no  urgency, frequency MSK:  No arthralgias, myalgias, back pain, swelling SKIN:  + rash poss on Lt labia, no color changes, pallor NEURO: No  numbness, weakness, tingling, seizures, dizziness, tremors PSYCH:  No depression, anxiety, behavioral problems, confusion  HEME/LYMPH: No  easy bruising or bleeding ENDO: No  heat/cold intolerance  Past Obstetrical History: OB History    No data available      Past Gynecologic History: No LMP recorded. Patient has had a hysterectomy.  Past Medical History: Past Medical History  Diagnosis Date  . GERD (gastroesophageal reflux disease)   . Arthritis     Past Surgical History:   Past Surgical History  Procedure Laterality Date  . Partial hysterectomy    . Abdominal hysterectomy    . Tubal ligation      Family History:  family history is not on file.  Social History:  Social History    Social History  . Marital Status: Married    Spouse Name: N/A  . Number of Children: N/A  . Years of Education: N/A   Occupational History  . Not on file.   Social History Main Topics  . Smoking status: Never Smoker   . Smokeless tobacco: Never Used  . Alcohol Use: No  . Drug Use: No  . Sexual Activity: Not on file   Other Topics Concern  . Not on file   Social History Narrative    Home Medications:  Medications reconciled in EPIC  Current Outpatient Prescriptions on File Prior to Visit  Medication Sig Dispense Refill  . aspirin EC 81 MG tablet Take 81 mg by mouth daily.    . Cyanocobalamin (VITAMIN B-12 PO) Take 1 tablet by mouth daily.    . folic acid (FOLVITE) 1 MG tablet Take 1 mg by mouth daily.    . isosorbide mononitrate (IMDUR) 30 MG 24 hr tablet Take 1 tablet (30 mg total) by mouth daily. 5 tablet 0  . methotrexate (RHEUMATREX) 2.5 MG tablet Take 2.5 mg by mouth. 6 tablets every 7 days    . metoprolol (LOPRESSOR) 25 MG tablet Take 1 tablet (25 mg total) by mouth 2 (two) times daily. 10 tablet 0  . metroNIDAZOLE (FLAGYL) 500 MG tablet Take 1 tablet (500 mg total) by mouth 2 (two) times daily. 14 tablet 0  .  ondansetron (ZOFRAN ODT) 4 MG disintegrating tablet Take 1 tablet (4 mg total) by mouth every 8 (eight) hours as needed for nausea or vomiting. 20 tablet 0  . oxyCODONE-acetaminophen (ROXICET) 5-325 MG tablet Take 1 tablet by mouth every 6 (six) hours as needed. 15 tablet 0  . ranitidine (ZANTAC) 150 MG tablet Take 150 mg by mouth daily.     Marland Kitchen sulfamethoxazole-trimethoprim (BACTRIM DS,SEPTRA DS) 800-160 MG tablet Take 1 tablet by mouth 2 (two) times daily. 20 tablet 0   No current facility-administered medications on file prior to visit.    Allergies:  Allergies  Allergen Reactions  . 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 Penicillins Rash    Physical Exam:  T 99.6  See BP ranges, sl  Tachycardic   General Appearance:  Well developed, well nourished, no acute distress, alert and oriented x3 HEENT:  Normocephalic atraumatic, extraocular movements intact, moist mucous membranes. TM's: no erythema, no dullness. Cardiovascular:  Normal S1/S2, regular rate and rhythm, no murmurs Pulmonary:  clear to auscultation, no wheezes, rales or rhonchi, symmetric air entry, good air exchange Abdomen:  Bowel sounds present, soft, nontender, nondistended, no abnormal masses, no epigastric pain Extremities:  Full range of motion, no pedal edema, 2+ distal pulses, no tenderness Skin:  normal coloration and turgor, no rashes, no suspicious skin lesions noted  Neurologic:  Cranial nerves 2-12 grossly intact, normal muscle tone, strength 5/5 all four extremities Psychiatric:  Normal mood and affect, appropriate to stimuli GYN: On exam: Lt labia appears sl edematous. There is a crusty yellowish tint of vag dc at the introital opening. There is a 1 cm node palp in the Lt groin. No other nodes palp. Vagina: No vag odor, no erythema, no lesions. Speculum was not done as ER doctor performed as well as a neg GC/CH done. No Bartholin's present bilat.  Pelvic:  NEFG, no vulvar masses or lesions, normal vaginal mucosa, no vaginal bleeding, + crusty yellow discharge, no pelvic organ prolapse, Lt laibia sl enlarged.    Labs/Studies:   CBC and Coags:  Lab Results  Component Value Date   WBC 10.5 09/16/2015   NEUTOPHILPCT 89 09/16/2015   EOSPCT 0 09/16/2015   BASOPCT 0 09/16/2015   LYMPHOPCT 8 09/16/2015   HGB 12.8 09/16/2015   HCT 37.7 09/16/2015   MCV 92.3 09/16/2015   PLT 272 09/16/2015   CMP:  Lab Results  Component Value Date   NA 135 09/16/2015   K 4.0 09/16/2015   CL 101 09/16/2015   CO2 25 09/16/2015   BUN 13 09/16/2015   CREATININE 0.63 09/16/2015   CREATININE 0.73 04/11/2015   PROT 6.7 09/16/2015   BILITOT 0.9 09/16/2015   ALT 16 09/16/2015   AST 18 09/16/2015   ALKPHOS 77  09/16/2015   Other Labs: Urine ess neg, GC/CH neg  TVUS:  Not done Other Imaging: No results found.   Assessment / Plan:   Amy Kirby is a 39 y.o. No obstetric history on file. who presents with Lt labial cellulitis, Nausea and Vomiting.  1. Lt labial cellulitis 2. Nausea/Vomiting 3. Headache (now resolved) 4. Low grade fever  P: 1. Admit for hydration, meds and antibiotics. 2. Blood cultures are pending. 3. Ancef 2 gms IV x 1 then 1 gm IV q 8 hours. 4. CBC in am. 5. Will write orders on floor.  Thank you for the opportunity to be involved with this  pt's care.   Sharee Pimple, MSN, CNM, FNP

## 2015-09-16 NOTE — ED Notes (Signed)
Patient having redness and itching occurring after administration of vancomycin.  Vanc stopped, MD informed, see MAR.

## 2015-09-16 NOTE — ED Provider Notes (Addendum)
[facility] Emergency Department Provider Note  Time seen: 2:19 PM  I have reviewed the triage vital signs and the nursing notes.   HISTORY  Chief Complaint Headache    HPI Amy Kirby is a 39 y.o. female with a past medical history of arthritis, gastric reflux, who presents the emergency department with headache, nausea, vomiting. According to the patient for the past 5-6 days she has been feeling nauseated, has had a rash to her left labia, which she states is worsening. She saw urgent care 09/13/15 and was prescribed Flagyl for bacterial vaginitis. Patient states since that time the redness and swelling have increased, now the patient is feeling nauseated, has vomited several times today. Also states she has developed a headache since yesterday. Subjective fevers, although she states she has not taken her temperature. States she is also experiencing dysuria with a burning sensation when she urinates. Describes her headache as moderate, vaginal pain as moderate, and dysuria as moderate as well.     Past Medical History  Diagnosis Date  . GERD (gastroesophageal reflux disease)   . Arthritis     There are no active problems to display for this patient.   Past Surgical History  Procedure Laterality Date  . Partial hysterectomy    . Abdominal hysterectomy    . Tubal ligation      Current Outpatient Rx  Name  Route  Sig  Dispense  Refill  . folic acid (FOLVITE) 1 MG tablet   Oral   Take 1 mg by mouth daily.         . methotrexate (RHEUMATREX) 2.5 MG tablet   Oral   Take 2.5 mg by mouth. 6 tablets every 7 days         . metroNIDAZOLE (FLAGYL) 500 MG tablet   Oral   Take 1 tablet (500 mg total) by mouth 2 (two) times daily.   14 tablet   0   . ranitidine (ZANTAC) 150 MG tablet   Oral   Take 150 mg by mouth daily.          Marland Kitchen sulfamethoxazole-trimethoprim (BACTRIM DS,SEPTRA DS) 800-160 MG tablet   Oral   Take 1 tablet by mouth 2  (two) times daily.   20 tablet   0   . aspirin EC 81 MG tablet   Oral   Take 81 mg by mouth daily.         . Cyanocobalamin (VITAMIN B-12 PO)   Oral   Take 1 tablet by mouth daily.         . isosorbide mononitrate (IMDUR) 30 MG 24 hr tablet   Oral   Take 1 tablet (30 mg total) by mouth daily.   5 tablet   0   . metoprolol (LOPRESSOR) 25 MG tablet   Oral   Take 1 tablet (25 mg total) by mouth 2 (two) times daily.   10 tablet   0     Allergies Penicillins  No family history on file.  Social History Social History  Substance Use Topics  . Smoking status: Never Smoker   . Smokeless tobacco: Never Used  . Alcohol Use: No    Review of Systems Constitutional: Positive for subjective fever Cardiovascular: Negative for chest pain. Respiratory: Negative for shortness of breath. Gastrointestinal: Negative for abdominal pain Genitourinary: Positive for dysuria. Positive for vaginal rash/redness. Neurological: Positive for headache, denies any weakness or numbness. 10-point ROS otherwise negative.  ____________________________________________   PHYSICAL EXAM:  VITAL  SIGNS: ED Triage Vitals  Enc Vitals Group     BP 09/16/15 1208 100/51 mmHg     Pulse Rate 09/16/15 1208 104     Resp 09/16/15 1208 20     Temp 09/16/15 1208 99.6 F (37.6 C)     Temp Source 09/16/15 1208 Oral     SpO2 09/16/15 1208 97 %     Weight 09/16/15 1208 110 lb (49.896 kg)     Height 09/16/15 1208 5\' 5"  (1.651 m)     Head Cir --      Peak Flow --      Pain Score 09/16/15 1209 6     Pain Loc --      Pain Edu? --      Excl. in GC? --     Constitutional: Alert and oriented. Well appearing and in no distress. Eyes: Normal exam, 2 mm PERRL ENT   Head: Normocephalic and atraumatic.   Mouth/Throat: Mucous membranes are moist. Cardiovascular: Normal rate, regular rhythm. No murmur Respiratory: Normal respiratory effort without tachypnea nor retractions. Breath sounds are clear and  equal bilaterally. No wheezes/rales/rhonchi. Gastrointestinal: Soft and nontender. No distention.   Musculoskeletal: Nontender with normal range of motion in all extremities.  Neurologic:  Normal speech and language. No gross focal neurologic deficits Skin:  Skin is warm, dry and intact.  Psychiatric: Mood and affect are normal. Speech and behavior are normal.   ____________________________________________    INITIAL IMPRESSION / ASSESSMENT AND PLAN / ED COURSE  Pertinent labs & imaging results that were available during my care of the patient were reviewed by me and considered in my medical decision making (see chart for details).  Patient presents the emergency department with nausea, vomiting, headache, vaginal rash, dysuria. We will check labs, perform a pelvic exam, treat pain and nausea, and IV hydrate. Overall the patient appears well. Patient does have slight tachycardia as well as a temperature of 99.6 in the emergency department.  Pelvic exam shows very minimal amount of discharge, no CMT. Patient does have mild swelling of the left labia, no erythema noted. No abscess palpated, tissue is very soft, no signs of Bartholin's gland cyst or abscess. Possible early cellulitis.  I discussed these findings with the patient, she is to follow-up with her OB/GYN in the next 1-2 days for recheck. We will place the patient on Bactrim. Currently awaiting urinalysis. Patient states her headache is gone, she is feeling better after IV fluids, has not vomited since receiving Zofran.  We will discharge the patient home with Bactrim, as well as Zofran as needed. Patient states the headache is gone, she is feeling better but still feels tired/fatigued. I discussed the patient the need to follow up with her OB/GYN. She will call tomorrow.  Upon attempted discharge the patient states her pain is back, headache has returned and she is nauseated vomiting in the room again. Patient given morphine and  Zofran once again. We will take blood cultures, start on IV antibiotics and admit to the hospital.  Gynecology has seen the patient, we will admit to the hospital for further treatment. ____________________________________________   FINAL CLINICAL IMPRESSION(S) / ED DIAGNOSES  Headache Cellulitis, left labia  09/18/15, MD 09/16/15 1740  09/18/15, MD 09/16/15 09/18/15  Avon Gully, MD 09/16/15 2052

## 2015-09-16 NOTE — ED Notes (Signed)
Midwife at bedside for examination of patient.

## 2015-09-16 NOTE — ED Notes (Signed)
Headache, n/v,

## 2015-09-16 NOTE — Progress Notes (Signed)
Consult History and Physical   SERVICE: Gynecology Admit Day  Patient Name: Amy Kirby Patient MRN:   [id number]  CC: C/O "seen in Urgent Care on Monday with Lt labial infection and lymph node enlargement on the left". Treated with 2 antibiotics (Bactrim and Flagyl) per pt. On Tues, began to vomit and have hot sweats and had a Temp of 100.7 on Tues pm. The pt has been climbing a ladder at work at [employer] and feels that her underwear irritated her labial area. Then it began to swell around the lymph nodes on the Left as well. Now, she is having nausea and vomiting and cannot hold the meds or fluids down. Vomiting on exam.   HPI: Amy Kirby is a 39 y.o. No obstetric history on file. Pt had 1 SVD delivered by Dr Feliberto Gottron.    Review of Systems: positives in bold GEN:  + fevers, no  chills, weight changes, appetite changes, fatigue, + night sweats HEENT: + HA, no  vision changes, hearing loss, congestion, rhinorrhea, sinus pressure, dysphagia CV:  No  CP, palpitations PULM:  No SOB, cough GI:  No abd pain, N/V/D/C GU:  + dysuria, no  urgency, frequency MSK:  No arthralgias, myalgias, back pain, swelling SKIN:  + rash poss on Lt labia, no color changes, pallor NEURO: No  numbness, weakness, tingling, seizures, dizziness, tremors PSYCH:  No depression, anxiety, behavioral problems, confusion  HEME/LYMPH: No  easy bruising or bleeding ENDO: No  heat/cold intolerance  Past Obstetrical History: OB History    No data available      Past Gynecologic History: No LMP recorded. Patient has had a hysterectomy.  Past Medical History: Past Medical History  Diagnosis Date  . GERD (gastroesophageal reflux disease)   . Arthritis     Past Surgical History:   Past Surgical History  Procedure Laterality Date  . Partial hysterectomy    . Abdominal hysterectomy    . Tubal ligation      Family History:  family history is not on file.  Social History:  Social History    Social History  . Marital Status: Married    Spouse Name: N/A  . Number of Children: N/A  . Years of Education: N/A   Occupational History  . Not on file.   Social History Main Topics  . Smoking status: Never Smoker   . Smokeless tobacco: Never Used  . Alcohol Use: No  . Drug Use: No  . Sexual Activity: Not on file   Other Topics Concern  . Not on file   Social History Narrative    Home Medications:  Medications reconciled in EPIC  No current facility-administered medications on file prior to encounter.   Current Outpatient Prescriptions on File Prior to Encounter  Medication Sig Dispense Refill  . folic acid (FOLVITE) 1 MG tablet Take 1 mg by mouth daily.    . ranitidine (ZANTAC) 150 MG tablet Take 150 mg by mouth daily.     Marland Kitchen aspirin EC 81 MG tablet Take 81 mg by mouth daily.    . Cyanocobalamin (VITAMIN B-12 PO) Take 1 tablet by mouth daily.    . isosorbide mononitrate (IMDUR) 30 MG 24 hr tablet Take 1 tablet (30 mg total) by mouth daily. 5 tablet 0  . metoprolol (LOPRESSOR) 25 MG tablet Take 1 tablet (25 mg total) by mouth 2 (two) times daily. 10 tablet 0    Allergies:  Allergies  Allergen Reactions  . 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 Penicillins Rash    Physical Exam:  T 99.6  See BP ranges, sl Tachycardic   General Appearance:  Well developed, well nourished, no acute distress, alert and oriented x3 HEENT:  Normocephalic atraumatic, extraocular movements intact, moist mucous membranes. TM's: no erythema, no dullness. Cardiovascular:  Normal S1/S2, regular rate and rhythm, no murmurs Pulmonary:  clear to auscultation, no wheezes, rales or rhonchi, symmetric air entry, good air exchange Abdomen:  Bowel sounds present, soft, nontender, nondistended, no abnormal masses, no epigastric pain Extremities:  Full range of motion, no pedal edema, 2+ distal pulses, no tenderness Skin:  normal  coloration and turgor, no rashes, no suspicious skin lesions noted  Neurologic:  Cranial nerves 2-12 grossly intact, normal muscle tone, strength 5/5 all four extremities Psychiatric:  Normal mood and affect, appropriate to stimuli GYN: On exam: Lt labia appears sl edematous. There is a crusty yellowish tint of vag dc at the introital opening. There is a 1 cm node palp in the Lt groin. No other nodes palp. Vagina: No vag odor, no erythema, no lesions. Speculum was not done as ER doctor performed as well as a neg GC/CH done. No Bartholin's present bilat.  Pelvic:  NEFG, no vulvar masses or lesions, normal vaginal mucosa, no vaginal bleeding, + crusty yellow discharge, no pelvic organ prolapse, Lt laibia sl enlarged.    Labs/Studies:   CBC and Coags:  Lab Results  Component Value Date   WBC 10.5 09/16/2015   NEUTOPHILPCT 89 09/16/2015   EOSPCT 0 09/16/2015   BASOPCT 0 09/16/2015   LYMPHOPCT 8 09/16/2015   HGB 12.8 09/16/2015   HCT 37.7 09/16/2015   MCV 92.3 09/16/2015   PLT 272 09/16/2015   CMP:  Lab Results  Component Value Date   NA 135 09/16/2015   K 4.0 09/16/2015   CL 101 09/16/2015   CO2 25 09/16/2015   BUN 13 09/16/2015   CREATININE 0.63 09/16/2015   CREATININE 0.73 04/11/2015   PROT 6.7 09/16/2015   BILITOT 0.9 09/16/2015   ALT 16 09/16/2015   AST 18 09/16/2015   ALKPHOS 77 09/16/2015   Other Labs: Urine ess neg, GC/CH neg  TVUS:  Not done Other Imaging: No results found.   Assessment / Plan:   Amy Kirby is a 39 y.o. No obstetric history on file. who presents with Lt labial cellulitis, Nausea and Vomiting.  1. Lt labial cellulitis 2. Nausea/Vomiting 3. Headache (now resolved) 4. Low grade fever  P: 1. Admit for hydration, meds and antibiotics. 2. Blood cultures are pending. 3. Ancef 2 gms IV x 1 then 1 gm IV q 8 hours. 4. CBC in am. 5. Will write orders on floor.  Thank you for the opportunity to be involved with this pt's care.   Sharee Pimple, MSN, CNM, FNP

## 2015-09-17 DIAGNOSIS — N762 Acute vulvitis: Secondary | ICD-10-CM | POA: Diagnosis present

## 2015-09-17 LAB — URINALYSIS COMPLETE WITH MICROSCOPIC (ARMC ONLY)
BILIRUBIN URINE: NEGATIVE
Bacteria, UA: NONE SEEN
Glucose, UA: NEGATIVE mg/dL
Hgb urine dipstick: NEGATIVE
Leukocytes, UA: NEGATIVE
NITRITE: NEGATIVE
PH: 5 (ref 5.0–8.0)
PROTEIN: 30 mg/dL — AB
SPECIFIC GRAVITY, URINE: 1.029 (ref 1.005–1.030)
Squamous Epithelial / LPF: NONE SEEN

## 2015-09-17 MED ORDER — ONDANSETRON 8 MG PO TBDP
4.0000 mg | ORAL_TABLET | Freq: Four times a day (QID) | ORAL | Status: DC | PRN
  Administered 2015-09-17: 4 mg via ORAL
  Filled 2015-09-17: qty 1

## 2015-09-17 MED ORDER — CEFAZOLIN SODIUM 1-5 GM-% IV SOLN
1.0000 g | Freq: Four times a day (QID) | INTRAVENOUS | Status: DC
Start: 2015-09-17 — End: 2015-09-18
  Administered 2015-09-17 – 2015-09-18 (×5): 1 g via INTRAVENOUS
  Filled 2015-09-17 (×10): qty 50

## 2015-09-17 NOTE — Progress Notes (Signed)
Subjective: Patient reports nausea and vomiting that have been present, but have been controlled with medications. She has tenderness in her left labia at the area of concern. She had a low-grade temperature overnight at 100.5, but otherwise afebrile. Reports continued dysuria.   Objective: I have reviewed patient's vital signs, intake and output, medications, labs and microbiology.  General: alert, cooperative, fatigued and no distress Resp: clear to auscultation bilaterally Cardio: regular rate and rhythm, S1, S2 normal, no murmur, click, rub or gallop GI: soft, non-tender; bowel sounds normal; no masses,  no organomegaly Extremities: extremities normal, atraumatic, no cyanosis or edema Pelvic: Left labia remains edematous, tender, mild inquinal LAD. No evidence of bartholin's cyst, but this may be early in that process   Assessment/Plan: Continue with anceph, iv fluids, anti-emetics. UA from 12/29 shows + leukocytes, proteinuria. G/C negative.   Awaiting culture, repeat CBC   LOS: 1 day    GOODMAN, DAVID MICHAEL 09/17/2015, 10:09 AM

## 2015-09-17 NOTE — Progress Notes (Signed)
Patient in and out cath'd per MD orders to obtain a urine specimen. 300 ml of urine drained from patient's bladder. Procedure performed by Maureen Ralphs, RN and Velda Shell, RN assisted. 14 french tube used and sterile technique used for procedure. Patient tolerated well. Specimen sent to lab for testing.

## 2015-09-18 LAB — BASIC METABOLIC PANEL
Anion gap: 7 (ref 5–15)
BUN: 8 mg/dL (ref 6–20)
CALCIUM: 8.2 mg/dL — AB (ref 8.9–10.3)
CO2: 26 mmol/L (ref 22–32)
CREATININE: 0.53 mg/dL (ref 0.44–1.00)
Chloride: 107 mmol/L (ref 101–111)
GFR calc Af Amer: >60 mL/min (ref >60)
GFR calc non Af Amer: >60 mL/min (ref >60)
GLUCOSE: 76 mg/dL (ref 65–99)
Potassium: 3.5 mmol/L (ref 3.5–5.1)
Sodium: 140 mmol/L (ref 135–145)

## 2015-09-18 LAB — CBC WITH DIFFERENTIAL/PLATELET
BASOS PCT: 1 %
Basophils Absolute: 0 10*3/uL (ref 0–0.1)
Eosinophils Absolute: 0 10*3/uL (ref 0–0.7)
Eosinophils Relative: 1 %
HEMATOCRIT: 33.2 % — AB (ref 35.0–47.0)
Hemoglobin: 11.2 g/dL — ABNORMAL LOW (ref 12.0–16.0)
LYMPHS PCT: 37 %
Lymphs Abs: 1.5 10*3/uL (ref 1.0–3.6)
MCH: 31.1 pg (ref 26.0–34.0)
MCHC: 33.7 g/dL (ref 32.0–36.0)
MCV: 92.2 fL (ref 80.0–100.0)
MONO ABS: 0.4 10*3/uL (ref 0.2–0.9)
MONOS PCT: 9 %
NEUTROS ABS: 2.1 10*3/uL (ref 1.4–6.5)
Neutrophils Relative %: 52 %
Platelets: 217 10*3/uL (ref 150–440)
RBC: 3.6 MIL/uL — ABNORMAL LOW (ref 3.80–5.20)
RDW: 12.7 % (ref 11.5–14.5)
WBC: 4.1 10*3/uL (ref 3.6–11.0)

## 2015-09-18 MED ORDER — METRONIDAZOLE 0.75 % VA GEL: 1.0000 | Freq: Two times a day (BID) | VAGINAL | Status: DC

## 2015-09-18 MED ORDER — CEPHALEXIN 500 MG PO CAPS
500.0000 mg | ORAL_CAPSULE | Freq: Four times a day (QID) | ORAL | Status: DC
Start: 2015-09-18 — End: 2018-02-05

## 2015-09-18 MED ORDER — VALACYCLOVIR HCL 500 MG PO TABS
1000.0000 mg | ORAL_TABLET | ORAL | Status: AC
  Administered 2015-09-18: 1000 mg via ORAL
  Filled 2015-09-18: qty 2

## 2015-09-18 MED ORDER — VALACYCLOVIR HCL 1 G PO TABS: 1000.0000 mg | ORAL_TABLET | Freq: Every day | ORAL | Status: DC

## 2015-09-18 MED ORDER — BENZOCAINE-MENTHOL 20-0.5 % EX AERO: 1.0000 "application " | INHALATION_SPRAY | Freq: Four times a day (QID) | CUTANEOUS | Status: DC | PRN

## 2015-09-18 MED ORDER — ONDANSETRON 4 MG PO TBDP: 4.0000 mg | ORAL_TABLET | Freq: Four times a day (QID) | ORAL | Status: DC | PRN

## 2015-09-18 NOTE — H&P (Signed)
Consult History and Physical   SERVICE: Gynecology Admit Day  Patient Name: Amy Kirby Patient MRN:   [id number]  CC: C/O "seen in Urgent Care on Monday with Lt labial infection and lymph node enlargement on the left". Treated with 2 antibiotics (Bactrim and Flagyl) per pt. On Tues, began to vomit and have hot sweats and had a Temp of 100.7 on Tues pm. The pt has been climbing a ladder at work at [employer] and feels that her underwear irritated her labial area. Then it began to swell around the lymph nodes on the Left as well. Now, she is having nausea and vomiting and cannot hold the meds or fluids down. Vomiting on exam.   HPI: Amy Kirby is a 39 y.o. No obstetric history on file. Pt had 1 SVD delivered by Dr Feliberto Gottron.    Review of Systems: positives in bold GEN:  + fevers, no  chills, weight changes, appetite changes, fatigue, + night sweats HEENT: + HA, no  vision changes, hearing loss, congestion, rhinorrhea, sinus pressure, dysphagia CV:  No  CP, palpitations PULM:  No SOB, cough GI:  No abd pain, N/V/D/C GU:  + dysuria, no  urgency, frequency MSK:  No arthralgias, myalgias, back pain, swelling SKIN:  + rash poss on Lt labia, no color changes, pallor NEURO: No  numbness, weakness, tingling, seizures, dizziness, tremors PSYCH:  No depression, anxiety, behavioral problems, confusion  HEME/LYMPH: No  easy bruising or bleeding ENDO: No  heat/cold intolerance  Past Obstetrical History: OB History    No data available      Past Gynecologic History: No LMP recorded. Patient has had a hysterectomy.  Past Medical History: Past Medical History  Diagnosis Date  . GERD (gastroesophageal reflux disease)   . Arthritis     Past Surgical History:   Past Surgical History  Procedure Laterality Date  . Partial hysterectomy    . Abdominal hysterectomy    . Tubal ligation      Family History:  family history is not on file.  Social History:  Social History    Social History  . Marital Status: Married    Spouse Name: N/A  . Number of Children: N/A  . Years of Education: N/A   Occupational History  . Not on file.   Social History Main Topics  . Smoking status: Never Smoker   . Smokeless tobacco: Never Used  . Alcohol Use: No  . Drug Use: No  . Sexual Activity: Not on file   Other Topics Concern  . Not on file   Social History Narrative    Home Medications:  Medications reconciled in EPIC  No current facility-administered medications on file prior to encounter.   Current Outpatient Prescriptions on File Prior to Encounter  Medication Sig Dispense Refill  . folic acid (FOLVITE) 1 MG tablet Take 1 mg by mouth daily.    . ranitidine (ZANTAC) 150 MG tablet Take 150 mg by mouth daily.     Marland Kitchen aspirin EC 81 MG tablet Take 81 mg by mouth daily.    . Cyanocobalamin (VITAMIN B-12 PO) Take 1 tablet by mouth daily.    . isosorbide mononitrate (IMDUR) 30 MG 24 hr tablet Take 1 tablet (30 mg total) by mouth daily. 5 tablet 0  . metoprolol (LOPRESSOR) 25 MG tablet Take 1 tablet (25 mg total) by mouth 2 (two) times daily. 10 tablet 0    Allergies:  Allergies  Allergen Reactions  . 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 Penicillins Rash    Physical Exam:  T 99.6  See BP ranges, sl Tachycardic   General Appearance:  Well developed, well nourished, no acute distress, alert and oriented x3 HEENT:  Normocephalic atraumatic, extraocular movements intact, moist mucous membranes. TM's: no erythema, no dullness. Cardiovascular:  Normal S1/S2, regular rate and rhythm, no murmurs Pulmonary:  clear to auscultation, no wheezes, rales or rhonchi, symmetric air entry, good air exchange Abdomen:  Bowel sounds present, soft, nontender, nondistended, no abnormal masses, no epigastric pain Extremities:  Full range of motion, no pedal edema, 2+ distal pulses, no tenderness Skin:  normal  coloration and turgor, no rashes, no suspicious skin lesions noted  Neurologic:  Cranial nerves 2-12 grossly intact, normal muscle tone, strength 5/5 all four extremities Psychiatric:  Normal mood and affect, appropriate to stimuli GYN: On exam: Lt labia appears sl edematous. There is a crusty yellowish tint of vag dc at the introital opening. There is a 1 cm node palp in the Lt groin. No other nodes palp. Vagina: No vag odor, no erythema, no lesions. Speculum was not done as ER doctor performed as well as a neg GC/CH done. No Bartholin's present bilat.  Pelvic:  NEFG, no vulvar masses or lesions, normal vaginal mucosa, no vaginal bleeding, + crusty yellow discharge, no pelvic organ prolapse, Lt laibia sl enlarged.    Labs/Studies:   CBC and Coags:  Lab Results  Component Value Date   WBC 4.1 09/18/2015   NEUTOPHILPCT 52 09/18/2015   EOSPCT 1 09/18/2015   BASOPCT 1 09/18/2015   LYMPHOPCT 37 09/18/2015   HGB 11.2* 09/18/2015   HCT 33.2* 09/18/2015   MCV 92.2 09/18/2015   PLT 217 09/18/2015   CMP:  Lab Results  Component Value Date   NA 140 09/18/2015   K 3.5 09/18/2015   CL 107 09/18/2015   CO2 26 09/18/2015   BUN 8 09/18/2015   CREATININE 0.53 09/18/2015   CREATININE 0.63 09/16/2015   CREATININE 0.73 04/11/2015   PROT 6.7 09/16/2015   BILITOT 0.9 09/16/2015   ALT 16 09/16/2015   AST 18 09/16/2015   ALKPHOS 77 09/16/2015   Other Labs: Urine ess neg, GC/CH neg  TVUS:  Not done Other Imaging: No results found.   Assessment / Plan:   Amy Kirby is a 39 y.o. No obstetric history on file. who presents with Lt labial cellulitis, Nausea and Vomiting.  1. Lt labial cellulitis 2. Nausea/Vomiting 3. Headache (now resolved) 4. Low grade fever  P: 1. Admit for hydration, meds and antibiotics. 2. Blood cultures are pending. 3. Ancef 2 gms IV x 1 then 1 gm IV q 8 hours. 4. CBC in am. 5. Will write orders on floor.  Thank you for the opportunity to be involved  with this pt's care.   Sharee Pimple, MSN, CNM, FNP

## 2015-09-18 NOTE — Progress Notes (Signed)
Obstetric and Gynecology  Subjective  Amy Kirby is a 39 y.o. female. who presented on 09/16/2015 for  Lt labial swelling, Nausea and vomiting.  Pt admits to husband leaving her for another woman and returning to the marriage. Never had these symptoms before.     Objective   Filed Vitals:   09/18/15 0415 09/18/15 0757  BP: 90/44 102/53  Pulse: 66 68  Temp: 98.2 F (36.8 C) 98.2 F (36.8 C)  Resp: 18 18     Intake/Output Summary (Last 24 hours) at 09/18/15 0952 Last data filed at 09/18/15 0759  Gross per 24 hour  Intake    240 ml  Output   2700 ml  Net  -2460 ml    General: NAD Cardiovascular: RRR, no murmurs, S1S2 Pulmonary: CTAB, no W/R/R.  Abdomen: Benign. Non-tender, +BS, no guarding. Extremities: No erythema or cords, no calf tenderness, +warmth with normal peripheral pulses. Lt labia: edematous, no lesions seen. TTP. Labs: Results for orders placed or performed during the hospital encounter of 09/16/15 (from the past 24 hour(s))  Urinalysis complete, with microscopic ([facility] only)     Status: Abnormal   Collection Time: 09/17/15 12:43 PM  Result Value Ref Range   Color, Urine YELLOW (A) YELLOW   APPearance CLEAR (A) CLEAR   Glucose, UA NEGATIVE NEGATIVE mg/dL   Bilirubin Urine NEGATIVE NEGATIVE   Ketones, ur 2+ (A) NEGATIVE mg/dL   Specific Gravity, Urine 1.029 1.005 - 1.030   Hgb urine dipstick NEGATIVE NEGATIVE   pH 5.0 5.0 - 8.0   Protein, ur 30 (A) NEGATIVE mg/dL   Nitrite NEGATIVE NEGATIVE   Leukocytes, UA NEGATIVE NEGATIVE   RBC / HPF 0-5 0 - 5 RBC/hpf   WBC, UA 0-5 0 - 5 WBC/hpf   Bacteria, UA NONE SEEN NONE SEEN   Squamous Epithelial / LPF NONE SEEN NONE SEEN   Mucous PRESENT   CBC with Differential/Platelet     Status: Abnormal   Collection Time: 09/18/15  7:01 AM  Result Value Ref Range   WBC 4.1 3.6 - 11.0 K/uL   RBC 3.60 (L) 3.80 - 5.20 MIL/uL   Hemoglobin 11.2 (L) 12.0 - 16.0 g/dL   HCT 97.0 (L) 26.3 - 78.5 %   MCV 92.2 80.0 - 100.0  fL   MCH 31.1 26.0 - 34.0 pg   MCHC 33.7 32.0 - 36.0 g/dL   RDW 88.5 02.7 - 74.1 %   Platelets 217 150 - 440 K/uL   Neutrophils Relative % 52 %   Neutro Abs 2.1 1.4 - 6.5 K/uL   Lymphocytes Relative 37 %   Lymphs Abs 1.5 1.0 - 3.6 K/uL   Monocytes Relative 9 %   Monocytes Absolute 0.4 0.2 - 0.9 K/uL   Eosinophils Relative 1 %   Eosinophils Absolute 0.0 0 - 0.7 K/uL   Basophils Relative 1 %   Basophils Absolute 0.0 0 - 0.1 K/uL  Basic metabolic panel     Status: Abnormal   Collection Time: 09/18/15  7:01 AM  Result Value Ref Range   Sodium 140 135 - 145 mmol/L   Potassium 3.5 3.5 - 5.1 mmol/L   Chloride 107 101 - 111 mmol/L   CO2 26 22 - 32 mmol/L   Glucose, Bld 76 65 - 99 mg/dL   BUN 8 6 - 20 mg/dL   Creatinine, Ser 2.87 0.44 - 1.00 mg/dL   Calcium 8.2 (L) 8.9 - 10.3 mg/dL   GFR calc non Af Amer >60 >60  mL/min   GFR calc Af Amer >60 >60 mL/min   Anion gap 7 5 - 15    Cultures: Results for orders placed or performed during the hospital encounter of 09/13/15  Urine culture     Status: None   Collection Time: 09/13/15 12:22 PM  Result Value Ref Range Status   Specimen Description URINE, CLEAN CATCH  Final   Special Requests Normal  Final   Culture   Final    20,000 COLONIES/mL GROUP B STREP(S.AGALACTIAE)ISOLATED WITH  MULTIPLE SPECIES PRESENT, SUGGEST RECOLLECTION Virtually 100% of S. agalactiae (Group B) strains are susceptible to Penicillin.  For Penicillin-allergic patients, Erythromycin (85-95% sensitive) and Clindamycin (80% sensitive) are drugs of choice. Contact microbiology lab to request sensitivities if  needed within 7 days.    Report Status 09/15/2015 FINAL  Final  Wet prep, genital     Status: Abnormal   Collection Time: 09/13/15 12:56 PM  Result Value Ref Range Status   Yeast Wet Prep HPF POC NONE SEEN NONE SEEN Final   Trich, Wet Prep NONE SEEN NONE SEEN Final   Clue Cells Wet Prep HPF POC FEW (A) NONE SEEN Final   WBC, Wet Prep HPF POC NONE SEEN NONE  SEEN Final   Sperm NONE SEEN  Final  Chlamydia/NGC rt PCR ([facility] only)     Status: None   Collection Time: 09/13/15 12:56 PM  Result Value Ref Range Status   Specimen source GC/Chlam VAGINA  Final   Chlamydia Tr NOT DETECTED NOT DETECTED Final   N gonorrhoeae NOT DETECTED NOT DETECTED Final    Comment: (NOTE) 100  This methodology has not been evaluated in pregnant women or in 200  patients with a history of hysterectomy. 300 400  This methodology will not be performed on patients less than 92  years of age.   Hsv Culture And Typing     Status: Abnormal   Collection Time: 09/13/15  1:16 PM  Result Value Ref Range Status   HSV Culture/Type Comment (A)  Final    Comment: (NOTE) Positive for Herpes simplex virus type-2. Typing was confirmed by monoclonal antibody microscopic immunofluorescence. Performed At: [facility] [address] [place], [place] [id number] Mila Homer MD IT:[id number]    Source of Sample VAGINA  Final    Imaging: No results found.   Assessment   39 y.o. No obstetric history on file. 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  Low grade fever, lymphadenopathy, nausea and vomiting: Primary Outbreak. 1. HSV Type 2 2. Urinary Tract Infection (culture shows GBS +)  Plan   1. Will add food today and see if pt can tolerate. Pt will need to swallow pills before she can be discharged. 2. Will add Valtrex to pt's regimen: Valtrex 1 gm po qd till cellulitis resolved. 3. 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 Advised to have partner tested. 4. Will send home on Keflex 500 po qid x 10 days for UTI. 5. FU with me in 2 weeks.

## 2015-09-18 NOTE — Discharge Summary (Signed)
Signs and Symptoms to Report Call our office at [phone] if you have any of the following.  . Fever over 100.4 degrees or higher . Severe stomach pain not relieved with pain medications . Bright red bleeding that's heavier than a period that does not slow with rest . To go the bathroom a lot (frequency), you can't hold your urine (urgency), or it hurts when you empty your bladder (urinate) . Chest pain . Shortness of breath . Pain in the calves of your legs . Severe nausea and vomiting not relieved with anti-nausea medications . Signs of infection around your wounds, such as redness, hot to touch, swelling, green/yellow drainage (like pus), bad smelling discharge . Any concerns  . Don't drive if you are in pain or taking narcotic pain medicine. You may drive when you can safely slam on the brakes, turn the wheel forcefully, and rotate your torso comfortably. This is typically 1-2 weeks. Practice in a parking lot or side street prior to attempting to drive regularly.  . Ask others to help with household chores for 4 weeks. . Do not lift anything heavier that 10 pounds for 4-6 weeks. This includes pets, children, and groceries. . Don't do strenuous activities, exercises, or sports like vacuuming, tennis, squash, etc. until your doctor says it is safe to do so. ---If you had a hysterectomy (abdominal, laparoscopic, or vaginal) do not have intercourse for 8-10 weeks.  . Walk as you feel able. Rest often since it may take two or three weeks for your energy level to return to normal.  . You may climb stairs . Avoid constipation:   -Eat fruits, vegetables, and whole grains. Eat small meals as your appetite will take time to return to normal.   -Drink 6 to 8 glasses of water each day unless your doctor has told you to limit your fluids.   -Use a laxative or stool softener as needed if constipation becomes a problem. You may take Miralax, metamucil, Citrucil, Colace, Senekot, FiberCon, etc. If  this does not relieve the constipation, try two tablespoons of Milk Of Magnesia every 8 hours until your bowels move.  . You may shower. Gently wash the wounds with a mild soap and water. Pat dry. . Do not get in a hot tub, swimming pool, etc. until your doctor agrees. . Do not use lotions, oils, powders on the wounds. . Do not douche, use tampons, or have sex until your doctor says it is okay. . Take your pain medicine when you need it. The medicine may not work as well if the pain is bad.  Take the medicines you were taking before admisson. Other medications you will need are pain medications (Ibuprofen) and nausea medications (Zofran).

## 2015-09-18 NOTE — Discharge Summary (Signed)
The patient was admitted per the ER on 08/2915 for Lt labial cellulitis  Pt was treated with Antibiotics and mostly for N/V. Pt improved and was discharged on Post-Admit Day #2 with normal temperature, improved hydration and no nausea or vomiting.    Urine culture is pending but, initially showed GBS +. Pt also had +HSV culture of the Lt labia.  This was done and reported today from last week at the Urgent Care.  Patient was felt to be stable for discharge on post-admit Day 2when she was tolerating a regular diet, pain was controlled with no  pain medications, and she was ambulating and voiding without difficulty. Vital signs were stable and physical exam remained benign throughout her hospital stay. Lt labia is still edematous.  She will follow up per below for post-admit check with CJones, CNM.Marland Kitchen  Rx given for Valtrex and Keflex.  She was given specific instructions and numbers to call in written and verbal format. She verbalized understanding, agrees with the plan of care, and all questions answered to her satisfaction.

## 2015-09-18 NOTE — Discharge Instructions (Signed)
Please follow-up with your OB/GYN as soon as possible. Call on Tues for the next available appointment at [facility] to see Milon Score, CNM for recheck/reevaluation in 2 weeks.  Please take your antibiotics/medications as prescribed. Take your nausea medication as needed. Return to the emergency department for any fever, worsening pain, or any other symptom personally concerning to yourself.

## 2015-09-18 NOTE — Progress Notes (Signed)
Pt discharged home.  Discharge instructions, prescriptions and follow up appointment given to and reviewed with pt.  Pt verbalized understanding.  Escorted by auxillary. 

## 2015-09-18 NOTE — Progress Notes (Signed)
Pt is taking po well and had her first Valtrex without any problems. Will dc home

## 2015-09-19 ENCOUNTER — Telehealth: Payer: Self-pay | Admitting: Emergency Medicine

## 2015-09-19 LAB — URINE CULTURE
CULTURE: NO GROWTH
Special Requests: NORMAL

## 2015-09-19 NOTE — Telephone Encounter (Signed)
NOTE:  Labs reviewed from patients Urgent care visit 09/13/2015. Serum HSV tests negative. 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 Patient chart reviewed and found patient has been followed with OBGYN. Per [facility] Nurse Midwife's note, patient informed of HSV 2 positive and started on Valtrex as well as encouraged partner testing.      Ref Range 6d ago    HSV 1 Glycoprotein G Ab, IgG 0.00 - 0.90 index <0.91   Comments: (NOTE)                  Negative    <0.91                  Equivocal 0.91 - 1.09                  Positive    >1.09  Note: Negative indicates no antibodies detected to  HSV-1. Equivocal may suggest early infection. If  clinically appropriate, retest at later date. Positive  indicates antibodies detected to HSV-1.     HSV 2 Glycoprotein G Ab, IgG 0.00 - 0.90 index <0.91   Comments: (NOTE)                  Negative    <0.91                  Equivocal 0.91 - 1.09                  Positive    >1.09  Note: Negative indicates no antibodies detected to  HSV-2. Equivocal may suggest early infection. If  clinically appropriate, retest at later date. Positive  indicates antibodies detected to HSV-2.  Performed At: [facility]  [address] [place], [place] [id number]  Mila Homer MD UV:[id number]     Resulting Agency [facility]       Specimen Collected: 09/13/15 1:16 PM Last Resulted: 09/15/15 9:05 PM                      Other Results from 09/13/2015         HSV(herpes simplex vrs) 1+2 ab-IgM  Status: Finalresult Visible to patient:  Not Released Nextappt: None            Ref Range 6d ago    HSVI/II Comb IgM 0.00 - 0.90 Ratio <0.91   Comments: (NOTE)                  Negative    <0.91                  Equivocal 0.91 - 1.09                    Positive    >1.09  Performed At: [facility]  [address] [place], [place] [id number]  Mila Homer MD VF:[id number]     Resulting Agency [facility]       Specimen Collected: 09/13/15 1:16 PM Last Resulted: 09/15/15 9:05 PM                    Hsv Culture And Typing  Status: Finalresult Visible to patient:  Not Released Nextappt: None               6d ago    HSV Culture/Type Comment (A)   Comments: (NOTE)  Positive for Herpes simplex virus type-2. Typing was confirmed by  monoclonal antibody microscopic  immunofluorescence.  Performed At: [facility]  [address] [place], [place] [id number]  Mila Homer MD QI:[id number]     Source of Sample VAGINA   Resulting Agency [facility]      Specimen Collected: 09/13/15 1:16 PM Last Resulted: 09/16/15 3:36 PM

## 2015-09-20 ENCOUNTER — Telehealth: Payer: Self-pay | Admitting: Emergency Medicine

## 2015-09-20 NOTE — ED Notes (Signed)
Patient was notified that her HSV swab came back positive.  Patient states that she is taking Valtrex.  Patient was instructed to notify her partners so they can be tested.  Patient verbalized understanding.

## 2015-09-21 ENCOUNTER — Telehealth: Payer: Self-pay | Admitting: Emergency Medicine

## 2015-09-21 LAB — CULTURE, BLOOD (ROUTINE X 2)
CULTURE: NO GROWTH
CULTURE: NO GROWTH

## 2015-09-21 NOTE — Telephone Encounter (Signed)
Opened in error

## 2018-02-05 ENCOUNTER — Ambulatory Visit (INDEPENDENT_AMBULATORY_CARE_PROVIDER_SITE_OTHER): Payer: BLUE CROSS/BLUE SHIELD | Admitting: Family Medicine

## 2018-02-05 ENCOUNTER — Encounter: Payer: Self-pay | Admitting: Family Medicine

## 2018-02-05 VITALS — BP 100/62 | HR 78 | Ht 65.0 in | Wt 113.0 lb

## 2018-02-05 DIAGNOSIS — Z23 Encounter for immunization: Secondary | ICD-10-CM

## 2018-02-05 DIAGNOSIS — Z7689 Persons encountering health services in other specified circumstances: Secondary | ICD-10-CM

## 2018-02-05 NOTE — Progress Notes (Signed)
Name: Amy Kirby   MRN: [id number]    DOB: 1976/09/15   Date:02/05/2018       Progress Note  Subjective  Chief Complaint  Chief Complaint  Patient presents with  . Establish Care    nothing needed today    Patient desire to establish care with primary care.  Patient relates no medical concerns.  Health maintenance discussed and pap/gyn to be made.   No problem-specific Assessment & Plan notes found for this encounter.   Past Medical History:  Diagnosis Date  . Arthritis   . GERD (gastroesophageal reflux disease)     Past Surgical History:  Procedure Laterality Date  . ABDOMINAL HYSTERECTOMY    . PARTIAL HYSTERECTOMY    . TUBAL LIGATION      Family History  Problem Relation Age of Onset  . Diabetes Father   . Stroke Father   . Heart disease Sister   . Stroke Sister   . Cancer Maternal Grandmother   . Cancer Maternal Grandfather   . Heart disease Paternal Grandmother     Social History   Socioeconomic History  . Marital status: Married    Spouse name: Not on file  . Number of children: Not on file  . Years of education: Not on file  . Highest education level: Not on file  Occupational History  . Not on file  Social Needs  . Financial resource strain: Not on file  . Food insecurity:    Worry: Not on file    Inability: Not on file  . Transportation needs:    Medical: Not on file    Non-medical: Not on file  Tobacco Use  . Smoking status: Never Smoker  . Smokeless tobacco: Never Used  Substance and Sexual Activity  . Alcohol use: No  . Drug use: No  . Sexual activity: Not on file  Lifestyle  . Physical activity:    Days per week: Not on file    Minutes per session: Not on file  . Stress: Not on file  Relationships  . Social connections:    Talks on phone: Not on file    Gets together: Not on file    Attends religious service: Not on file    Active member of club or organization: Not on file    Attends meetings of clubs or organizations: Not  on file    Relationship status: Not on file  . Intimate partner violence:    Fear of current or ex partner: Not on file    Emotionally abused: Not on file    Physically abused: Not on file    Forced sexual activity: Not on file  Other Topics Concern  . Not on file  Social History Narrative  . Not on file    Allergies  Allergen Reactions  . 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 Penicillins Rash    Outpatient Medications Prior to Visit  Medication Sig Dispense Refill  . aspirin EC 81 MG tablet Take 81 mg by mouth daily.    . benzocaine-Menthol (DERMOPLAST) 20-0.5 % AERO Apply 1 application topically 4 (four) times daily as needed for irritation. 1 each 2  . cephALEXin (KEFLEX) 500 MG capsule Take 1 capsule (500 mg total) by mouth 4 (four) times daily. 40 capsule 0  . Cyanocobalamin (VITAMIN B-12 PO) Take 1 tablet by mouth daily.    . folic acid (FOLVITE) 1 MG tablet Take  1 mg by mouth daily.    Marland Kitchen ibuprofen (MOTRIN IB) 200 MG tablet Take 3 tablets (600 mg total) by mouth every 6 (six) hours as needed. 30 tablet 0  . isosorbide mononitrate (IMDUR) 30 MG 24 hr tablet Take 1 tablet (30 mg total) by mouth daily. 5 tablet 0  . methotrexate (RHEUMATREX) 2.5 MG tablet Take 15 mg by mouth once a week. 6 tablets every 7 days    . metoprolol (LOPRESSOR) 25 MG tablet Take 1 tablet (25 mg total) by mouth 2 (two) times daily. 10 tablet 0  . metroNIDAZOLE (METROGEL) 0.75 % vaginal gel Place 1 Applicatorful vaginally 2 (two) times daily. 70 g 0  . ondansetron (ZOFRAN-ODT) 4 MG disintegrating tablet Take 1 tablet (4 mg total) by mouth every 6 (six) hours as needed for nausea or vomiting. 20 tablet 0  . oxyCODONE-acetaminophen (ROXICET) 5-325 MG tablet Take 1 tablet by mouth every 6 (six) hours as needed. 15 tablet 0  . sulfamethoxazole-trimethoprim (BACTRIM DS,SEPTRA DS) 800-160 MG tablet Take 1 tablet by mouth 2 (two) times daily. 20  tablet 0  . valACYclovir (VALTREX) 1000 MG tablet Take 1 tablet (1,000 mg total) by mouth daily. 30 tablet 6   Facility-Administered Medications Prior to Visit  Medication Dose Route Frequency Provider Last Rate Last Dose  . morphine 4 MG/ML injection 4 mg  4 mg Intravenous Q4H PRN Sharee Pimple, CNM      . promethazine (PHENERGAN) injection 25 mg  25 mg Intravenous Q6H PRN Sharee Pimple, CNM        Review of Systems  Constitutional: Negative for chills, fever, malaise/fatigue and weight loss.  HENT: Negative for congestion, ear discharge, ear pain, hearing loss, nosebleeds, sinus pain, sore throat and tinnitus.   Eyes: Negative for blurred vision, double vision, photophobia and pain.  Respiratory: Negative for cough, hemoptysis, sputum production, shortness of breath, wheezing and stridor.   Cardiovascular: Negative for chest pain, palpitations, orthopnea, claudication, leg swelling and PND.  Gastrointestinal: Negative for abdominal pain, blood in stool, constipation, diarrhea, heartburn, melena, nausea and vomiting.  Genitourinary: Negative for dysuria, flank pain, frequency, hematuria and urgency.  Musculoskeletal: Negative for back pain, falls, joint pain, myalgias and neck pain.  Skin: Negative for rash.  Neurological: Negative for dizziness, tingling, tremors, sensory change, speech change, focal weakness, seizures, loss of consciousness, weakness and headaches.  Endo/Heme/Allergies: Negative for environmental allergies and polydipsia. Does not bruise/bleed easily.  Psychiatric/Behavioral: Negative for depression, hallucinations, memory loss, substance abuse and suicidal ideas. The patient is not nervous/anxious and does not have insomnia.      Objective  Vitals:   02/05/18 1440  BP: 100/62  Pulse: 78  Weight: 113 lb (51.3 kg)  Height: 5\' 5"  (1.651 m)    Physical Exam  Constitutional: No distress.  HENT:  Head: Normocephalic and atraumatic.  Right Ear: External ear  normal.  Left Ear: External ear normal.  Nose: Nose normal.  Mouth/Throat: Oropharynx is clear and moist.  Eyes: Pupils are equal, round, and reactive to light. Conjunctivae and EOM are normal. Right eye exhibits no discharge. Left eye exhibits no discharge.  Neck: Normal range of motion. Neck supple. No JVD present. No thyromegaly present.  Cardiovascular: Normal rate, regular rhythm, normal heart sounds and intact distal pulses. Exam reveals no gallop and no friction rub.  No murmur heard. Pulmonary/Chest: Effort normal and breath sounds normal.  Abdominal: Soft. Bowel sounds are normal. She exhibits no mass. There is no tenderness. There is  no guarding.  Musculoskeletal: Normal range of motion. She exhibits no edema.  Lymphadenopathy:    She has no cervical adenopathy.  Neurological: She is alert. She has normal reflexes.  Skin: Skin is warm and dry. She is not diaphoretic.  Nursing note and vitals reviewed.     Assessment & Plan  Problem List Items Addressed This Visit    None    Visit Diagnoses    Establishing care with new doctor, encounter for    -  Primary   Need for diphtheria-tetanus-pertussis (Tdap) vaccine       Relevant Orders   Tdap vaccine greater than or equal to 7yo IM (Completed)      No orders of the defined types were placed in this encounter.     Dr. [facility]  02/05/18

## 2018-02-05 NOTE — Patient Instructions (Signed)

## 2018-02-20 ENCOUNTER — Ambulatory Visit (INDEPENDENT_AMBULATORY_CARE_PROVIDER_SITE_OTHER): Payer: BLUE CROSS/BLUE SHIELD | Admitting: Family Medicine

## 2018-02-20 ENCOUNTER — Encounter: Payer: Self-pay | Admitting: Family Medicine

## 2018-02-20 ENCOUNTER — Other Ambulatory Visit: Payer: Self-pay | Admitting: Family Medicine

## 2018-02-20 VITALS — BP 100/70 | HR 80 | Ht 65.0 in | Wt 113.0 lb

## 2018-02-20 DIAGNOSIS — Z0000 Encounter for general adult medical examination without abnormal findings: Secondary | ICD-10-CM | POA: Diagnosis not present

## 2018-02-20 DIAGNOSIS — Z1272 Encounter for screening for malignant neoplasm of vagina: Secondary | ICD-10-CM | POA: Diagnosis not present

## 2018-02-20 DIAGNOSIS — B373 Candidiasis of vulva and vagina: Secondary | ICD-10-CM | POA: Diagnosis not present

## 2018-02-20 DIAGNOSIS — N3090 Cystitis, unspecified without hematuria: Secondary | ICD-10-CM | POA: Diagnosis not present

## 2018-02-20 DIAGNOSIS — B3731 Acute candidiasis of vulva and vagina: Secondary | ICD-10-CM

## 2018-02-20 DIAGNOSIS — Z1211 Encounter for screening for malignant neoplasm of colon: Secondary | ICD-10-CM | POA: Diagnosis not present

## 2018-02-20 DIAGNOSIS — Z1231 Encounter for screening mammogram for malignant neoplasm of breast: Secondary | ICD-10-CM

## 2018-02-20 DIAGNOSIS — Z1239 Encounter for other screening for malignant neoplasm of breast: Secondary | ICD-10-CM

## 2018-02-20 DIAGNOSIS — Z01419 Encounter for gynecological examination (general) (routine) without abnormal findings: Secondary | ICD-10-CM

## 2018-02-20 LAB — POCT URINALYSIS DIPSTICK
BILIRUBIN UA: NEGATIVE
Blood, UA: NEGATIVE
GLUCOSE UA: NEGATIVE
KETONES UA: NEGATIVE
Nitrite, UA: NEGATIVE
Protein, UA: NEGATIVE
Spec Grav, UA: 1.01 (ref 1.010–1.025)
Urobilinogen, UA: 0.2 E.U./dL
pH, UA: 6 (ref 5.0–8.0)

## 2018-02-20 LAB — HEMOCCULT GUIAC POC 1CARD (OFFICE): Fecal Occult Blood, POC: NEGATIVE

## 2018-02-20 MED ORDER — FLUCONAZOLE 150 MG PO TABS: 150.0000 mg | ORAL_TABLET | Freq: Once | ORAL | 0 refills | Status: AC

## 2018-02-20 MED ORDER — CEPHALEXIN 500 MG PO CAPS: 500.0000 mg | ORAL_CAPSULE | Freq: Two times a day (BID) | ORAL | 0 refills | Status: DC

## 2018-02-20 MED ORDER — NYSTATIN 100000 UNIT/GM EX CREA: 1.0000 "application " | TOPICAL_CREAM | Freq: Two times a day (BID) | CUTANEOUS | 0 refills | Status: DC

## 2018-02-20 NOTE — Progress Notes (Signed)
Name: Amy Kirby   MRN: [id number]    DOB: 1975-11-27   Date:02/20/2018       Progress Note  Subjective  Chief Complaint  Chief Complaint  Patient presents with  . Annual Exam    needs pap and breast exam/ mammo    Urinary Tract Infection   This is a new problem. The current episode started in the past 7 days (sunday). The problem occurs every urination. The problem has been gradually worsening. The quality of the pain is described as burning. The pain is mild. There has been no fever. She is sexually active. There is no history of pyelonephritis. Associated symptoms include a discharge and frequency. Pertinent negatives include no chills, flank pain, hematuria, hesitancy, nausea, sweats, urgency or vomiting. Associated symptoms comments: Baseline discharge. She has tried nothing for the symptoms. The treatment provided moderate relief.    No problem-specific Assessment & Plan notes found for this encounter.   Past Medical History:  Diagnosis Date  . Arthritis   . GERD (gastroesophageal reflux disease)     Past Surgical History:  Procedure Laterality Date  . ABDOMINAL HYSTERECTOMY    . PARTIAL HYSTERECTOMY    . TUBAL LIGATION      Family History  Problem Relation Age of Onset  . Diabetes Father   . Stroke Father   . Heart disease Sister   . Stroke Sister   . Cancer Maternal Grandmother   . Cancer Maternal Grandfather   . Heart disease Paternal Grandmother     Social History   Socioeconomic History  . Marital status: Married    Spouse name: Not on file  . Number of children: Not on file  . Years of education: Not on file  . Highest education level: Not on file  Occupational History  . Not on file  Social Needs  . Financial resource strain: Not on file  . Food insecurity:    Worry: Not on file    Inability: Not on file  . Transportation needs:    Medical: Not on file    Non-medical: Not on file  Tobacco Use  . Smoking status: Never Smoker  . Smokeless  tobacco: Never Used  Substance and Sexual Activity  . Alcohol use: No  . Drug use: No  . Sexual activity: Not on file  Lifestyle  . Physical activity:    Days per week: Not on file    Minutes per session: Not on file  . Stress: Not on file  Relationships  . Social connections:    Talks on phone: Not on file    Gets together: Not on file    Attends religious service: Not on file    Active member of club or organization: Not on file    Attends meetings of clubs or organizations: Not on file    Relationship status: Not on file  . Intimate partner violence:    Fear of current or ex partner: Not on file    Emotionally abused: Not on file    Physically abused: Not on file    Forced sexual activity: Not on file  Other Topics Concern  . Not on file  Social History Narrative  . Not on file    Allergies  Allergen Reactions  . 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 Penicillins Rash    No outpatient medications prior to visit.   Facility-Administered Medications Prior to Visit  Medication Dose Route Frequency Provider Last Rate Last Dose  . morphine 4 MG/ML injection 4 mg  4 mg Intravenous Q4H PRN Sharee Pimple, CNM      . promethazine (PHENERGAN) injection 25 mg  25 mg Intravenous Q6H PRN Sharee Pimple, CNM        Review of Systems  Constitutional: Negative for chills, fever, malaise/fatigue and weight loss.  HENT: Negative for ear discharge, ear pain and sore throat.   Eyes: Negative for blurred vision.  Respiratory: Negative for cough, sputum production, shortness of breath and wheezing.   Cardiovascular: Negative for chest pain, palpitations and leg swelling.  Gastrointestinal: Negative for abdominal pain, blood in stool, constipation, diarrhea, heartburn, melena, nausea and vomiting.  Genitourinary: Positive for frequency. Negative for dysuria, flank pain, hematuria, hesitancy and urgency.   Musculoskeletal: Negative for back pain, joint pain, myalgias and neck pain.  Skin: Negative for rash.  Neurological: Negative for dizziness, tingling, sensory change, focal weakness and headaches.  Endo/Heme/Allergies: Negative for environmental allergies and polydipsia. Does not bruise/bleed easily.  Psychiatric/Behavioral: Negative for depression and suicidal ideas. The patient is not nervous/anxious and does not have insomnia.      Objective  Vitals:   02/20/18 0940  BP: 100/70  Pulse: 80  Weight: 113 lb (51.3 kg)  Height: 5\' 5"  (1.651 m)    Physical Exam  Constitutional: She is oriented to person, place, and time. Vital signs are normal. She appears well-developed and well-nourished.  HENT:  Head: Normocephalic and atraumatic.  Right Ear: Hearing, tympanic membrane, external ear and ear canal normal.  Left Ear: Hearing, tympanic membrane, external ear and ear canal normal.  Nose: Nose normal.  Mouth/Throat: Uvula is midline and oropharynx is clear and moist. No oropharyngeal exudate, posterior oropharyngeal edema or posterior oropharyngeal erythema.  Eyes: Pupils are equal, round, and reactive to light. Conjunctivae, EOM and lids are normal. Lids are everted and swept, no foreign bodies found. Left eye exhibits no hordeolum. No foreign body present in the left eye. Right conjunctiva is not injected. Left conjunctiva is not injected. No scleral icterus.  Fundoscopic exam:      The right eye shows no arteriolar narrowing and no AV nicking.       The left eye shows no arteriolar narrowing and no AV nicking.  Neck: Trachea normal and normal range of motion. Neck supple. Normal carotid pulses, no hepatojugular reflux and no JVD present. No tracheal tenderness present. Carotid bruit is not present. No tracheal deviation present. No thyroid mass and no thyromegaly present.  Cardiovascular: Normal rate, regular rhythm, S1 normal, S2 normal, normal heart sounds, intact distal pulses and  normal pulses. PMI is not displaced. Exam reveals no gallop, no S3, no S4, no friction rub and no decreased pulses.  No murmur heard.  No systolic murmur is present.  No diastolic murmur is present. Pulses:      Carotid pulses are 2+ on the right side, and 2+ on the left side.      Radial pulses are 2+ on the right side, and 2+ on the left side.       Femoral pulses are 2+ on the right side, and 2+ on the left side.      Popliteal pulses are 2+ on the right side, and 2+ on the left side.       Dorsalis pedis pulses are 2+ on the right side, and 2+ on the left side.       Posterior tibial pulses  are 2+ on the right side, and 2+ on the left side.  Pulmonary/Chest: Effort normal and breath sounds normal. No stridor. No respiratory distress. She has no decreased breath sounds. She has no wheezes. She has no rhonchi. She has no rales. She exhibits no mass and no tenderness. Right breast exhibits no inverted nipple, no mass, no nipple discharge, no skin change and no tenderness. Left breast exhibits no inverted nipple, no mass, no nipple discharge, no skin change and no tenderness. No breast swelling, tenderness, discharge or bleeding. Breasts are symmetrical.  Abdominal: Soft. Normal aorta and bowel sounds are normal. She exhibits no mass. There is no hepatosplenomegaly. There is no tenderness. There is no rebound and no guarding. No hernia.  Genitourinary: Rectum normal. Rectal exam shows no mass, no tenderness and guaiac negative stool. No breast swelling, tenderness, discharge or bleeding. Pelvic exam was performed with patient supine. There is rash on the right labia. There is no tenderness or lesion on the right labia. There is rash on the left labia. There is no tenderness or lesion on the left labia. Cervix exhibits no motion tenderness and no discharge. Right adnexum displays no mass, no tenderness and no fullness. Left adnexum displays no mass, no tenderness and no fullness. There is erythema in the  vagina.  Genitourinary Comments: hysterectomy  Musculoskeletal: Normal range of motion. She exhibits no edema or tenderness.  Lymphadenopathy:    She has no cervical adenopathy.    She has no axillary adenopathy.  Neurological: She is alert and oriented to person, place, and time. She has normal strength and normal reflexes. She displays normal reflexes. No cranial nerve deficit or sensory deficit.  Reflex Scores:      Tricep reflexes are 2+ on the right side and 2+ on the left side.      Bicep reflexes are 2+ on the right side and 2+ on the left side.      Brachioradialis reflexes are 2+ on the right side and 2+ on the left side.      Patellar reflexes are 2+ on the right side and 2+ on the left side.      Achilles reflexes are 2+ on the right side and 2+ on the left side. Skin: Skin is warm, dry and intact. No rash noted. She is not diaphoretic. No pallor.  Psychiatric: She has a normal mood and affect. Her mood appears not anxious. She does not exhibit a depressed mood.  Nursing note and vitals reviewed.     Assessment & Plan  Problem List Items Addressed This Visit    None    Visit Diagnoses    Annual physical exam    -  Primary   No bjective findings of concern /will check lipid/renal panels   Relevant Orders   Renal Function Panel   Lipid panel   POCT Occult Blood Stool (Completed)   POCT urinalysis dipstick (Completed)   Pap IG (Image Guided)   MM Digital Screening   Gynecologic exam normal       Cystitis        symptoms /U/A  indicates acute infection will treat with 3 day course cephalexin 500mg  bid.   Relevant Medications   cephALEXin (KEFLEX) 500 MG capsule   Other Relevant Orders   POCT urinalysis dipstick (Completed)   Yeast vaginitis       Exam indicates likely yeast infection will treat with diflucan/nystatin.   Relevant Medications   cephALEXin (KEFLEX) 500 MG capsule   nystatin  cream (MYCOSTATIN)   fluconazole (DIFLUCAN) 150 MG tablet   Colon cancer  screening       Relevant Orders   POCT Occult Blood Stool (Completed)   Screening for vaginal cancer       Relevant Orders   Pap IG (Image Guided)   Breast cancer screening       Relevant Orders   MM Digital Screening      Meds ordered this encounter  Medications  . cephALEXin (KEFLEX) 500 MG capsule    Sig: Take 1 capsule (500 mg total) by mouth 2 (two) times daily.    Dispense:  6 capsule    Refill:  0  . nystatin cream (MYCOSTATIN)    Sig: Apply 1 application topically 2 (two) times daily.    Dispense:  30 g    Refill:  0  . fluconazole (DIFLUCAN) 150 MG tablet    Sig: Take 1 tablet (150 mg total) by mouth once for 1 dose.    Dispense:  1 tablet    Refill:  0      Dr. Elizabeth Sauer [facility]  02/20/18

## 2018-02-21 LAB — RENAL FUNCTION PANEL
Albumin: 4.4 g/dL (ref 3.5–5.5)
BUN / CREAT RATIO: 13 (ref 9–23)
BUN: 8 mg/dL (ref 6–24)
CO2: 25 mmol/L (ref 20–29)
Calcium: 9.5 mg/dL (ref 8.7–10.2)
Chloride: 103 mmol/L (ref 96–106)
Creatinine, Ser: 0.6 mg/dL (ref 0.57–1.00)
GFR, EST AFRICAN AMERICAN: 132 mL/min/{1.73_m2} (ref >59)
GFR, EST NON AFRICAN AMERICAN: 114 mL/min/{1.73_m2} (ref >59)
GLUCOSE: 78 mg/dL (ref 65–99)
PHOSPHORUS: 3.5 mg/dL (ref 2.5–4.5)
POTASSIUM: 4.4 mmol/L (ref 3.5–5.2)
SODIUM: 140 mmol/L (ref 134–144)

## 2018-02-21 LAB — LIPID PANEL
CHOL/HDL RATIO: 2.1 ratio (ref 0.0–4.4)
Cholesterol, Total: 160 mg/dL (ref 100–199)
HDL: 77 mg/dL (ref >39)
LDL CALC: 72 mg/dL (ref 0–99)
Triglycerides: 53 mg/dL (ref 0–149)
VLDL Cholesterol Cal: 11 mg/dL (ref 5–40)

## 2018-02-26 LAB — PAP IG (IMAGE GUIDED): PAP Smear Comment: 0

## 2018-02-28 ENCOUNTER — Other Ambulatory Visit: Payer: Self-pay

## 2018-02-28 DIAGNOSIS — B373 Candidiasis of vulva and vagina: Secondary | ICD-10-CM

## 2018-02-28 DIAGNOSIS — B3731 Acute candidiasis of vulva and vagina: Secondary | ICD-10-CM

## 2018-02-28 MED ORDER — FLUCONAZOLE 150 MG PO TABS: 150.0000 mg | ORAL_TABLET | Freq: Once | ORAL | 0 refills | Status: AC

## 2018-11-15 ENCOUNTER — Other Ambulatory Visit: Payer: Self-pay

## 2018-11-15 ENCOUNTER — Ambulatory Visit
Admission: EM | Admit: 2018-11-15 | Discharge: 2018-11-15 | Disposition: A | Discharge status: Home / Self Care | Payer: BLUE CROSS/BLUE SHIELD | Attending: Family Medicine | Admitting: Family Medicine

## 2018-11-15 ENCOUNTER — Encounter: Payer: Self-pay | Admitting: Emergency Medicine

## 2018-11-15 DIAGNOSIS — R51 Headache: Secondary | ICD-10-CM | POA: Diagnosis not present

## 2018-11-15 DIAGNOSIS — M791 Myalgia, unspecified site: Secondary | ICD-10-CM | POA: Diagnosis not present

## 2018-11-15 DIAGNOSIS — J111 Influenza due to unidentified influenza virus with other respiratory manifestations: Secondary | ICD-10-CM

## 2018-11-15 DIAGNOSIS — R1110 Vomiting, unspecified: Secondary | ICD-10-CM

## 2018-11-15 DIAGNOSIS — R0981 Nasal congestion: Secondary | ICD-10-CM | POA: Diagnosis not present

## 2018-11-15 DIAGNOSIS — R0982 Postnasal drip: Secondary | ICD-10-CM | POA: Diagnosis not present

## 2018-11-15 DIAGNOSIS — B9789 Other viral agents as the cause of diseases classified elsewhere: Secondary | ICD-10-CM

## 2018-11-15 DIAGNOSIS — R5383 Other fatigue: Secondary | ICD-10-CM

## 2018-11-15 DIAGNOSIS — R69 Illness, unspecified: Secondary | ICD-10-CM

## 2018-11-15 MED ORDER — OSELTAMIVIR PHOSPHATE 75 MG PO CAPS: 75.0000 mg | ORAL_CAPSULE | Freq: Two times a day (BID) | ORAL | 0 refills | Status: DC

## 2018-11-15 MED ORDER — ONDANSETRON 8 MG PO TBDP: 8.0000 mg | ORAL_TABLET | Freq: Three times a day (TID) | ORAL | 0 refills | Status: DC | PRN

## 2018-11-15 NOTE — ED Provider Notes (Signed)
[facility]    CSN: [id number] Arrival date & time: 11/15/18  1723     History   Chief Complaint Chief Complaint  Patient presents with  . Headache  . Generalized Body Aches  . Emesis    HPI Amy Kirby is a 43 y.o. female.   The history is provided by the patient.  Headache  Associated symptoms: congestion, fatigue, myalgias, URI and vomiting   Emesis  Associated symptoms: headaches, myalgias and URI   URI  Presenting symptoms: congestion, fatigue and rhinorrhea   Severity:  Moderate Onset quality:  Sudden Duration:  1 day Timing:  Constant Progression:  Worsening Chronicity:  New Relieved by:  OTC medications Associated symptoms: headaches and myalgias   Associated symptoms comment:  Vomited 3 times today Risk factors: sick contacts (flu exposure)     Past Medical History:  Diagnosis Date  . Arthritis   . GERD (gastroesophageal reflux disease)     Patient Active Problem List   Diagnosis Date Noted  . Cellulitis of labia 09/17/2015  . Cellulitis 09/16/2015    Past Surgical History:  Procedure Laterality Date  . ABDOMINAL HYSTERECTOMY    . PARTIAL HYSTERECTOMY    . TUBAL LIGATION      OB History   No obstetric history on file.      Home Medications    Prior to Admission medications   Medication Sig Start Date End Date Taking? Authorizing Provider  cephALEXin (KEFLEX) 500 MG capsule Take 1 capsule (500 mg total) by mouth 2 (two) times daily. 02/20/18   Duanne LimerickJones, Deanna C, MD  nystatin cream (MYCOSTATIN) Apply 1 application topically 2 (two) times daily. 02/20/18   Duanne LimerickJones, Deanna C, MD  ondansetron (ZOFRAN ODT) 8 MG disintegrating tablet Take 1 tablet (8 mg total) by mouth every 8 (eight) hours as needed. 11/15/18   Payton Mccallumonty, Kedric Bumgarner, MD  oseltamivir (TAMIFLU) 75 MG capsule Take 1 capsule (75 mg total) by mouth 2 (two) times daily. 11/15/18   Payton Mccallumonty, Keishla Oyer, MD    Family History Family History  Problem Relation Age of Onset  . Diabetes  Father   . Stroke Father   . Heart disease Sister   . Stroke Sister   . Cancer Maternal Grandmother   . Cancer Maternal Grandfather   . Heart disease Paternal Grandmother   . Depression Mother     Social History Social History   Tobacco Use  . Smoking status: Never Smoker  . Smokeless tobacco: Never Used  Substance Use Topics  . Alcohol use: Yes    Comment: occassional  . Drug use: No     Allergies   Vancomycin and Penicillins   Review of Systems Review of Systems  Constitutional: Positive for fatigue.  HENT: Positive for congestion and rhinorrhea.   Gastrointestinal: Positive for vomiting.  Musculoskeletal: Positive for myalgias.  Neurological: Positive for headaches.     Physical Exam Triage Vital Signs ED Triage Vitals  Enc Vitals Group     BP 11/15/18 1750 99/60     Pulse Rate 11/15/18 1750 85     Resp 11/15/18 1750 16     Temp 11/15/18 1750 99.4 F (37.4 C)     Temp Source 11/15/18 1750 Oral     SpO2 11/15/18 1750 100 %     Weight 11/15/18 1750 110 lb (49.9 kg)     Height 11/15/18 1750 5\' 5"  (1.651 m)     Head Circumference --      Peak Flow --  Pain Score 11/15/18 1749 10     Pain Loc --      Pain Edu? --      Excl. in [place]? --    No data found.  Updated Vital Signs BP 99/60 (BP Location: Left Arm)   Pulse 85   Temp 99.4 F (37.4 C) (Oral)   Resp 16   Ht 5\' 5"  (1.651 m)   Wt 49.9 kg   SpO2 100%   BMI 18.30 kg/m   Visual Acuity Right Eye Distance:   Left Eye Distance:   Bilateral Distance:    Right Eye Near:   Left Eye Near:    Bilateral Near:     Physical Exam Vitals signs and nursing note reviewed.  Constitutional:      General: She is not in acute distress.    Appearance: She is well-developed. She is not toxic-appearing or diaphoretic.  HENT:     Head: Normocephalic and atraumatic.     Nose: Mucosal edema and rhinorrhea present. No nasal deformity, septal deviation or laceration.     Right Sinus: Maxillary sinus  tenderness and frontal sinus tenderness present.     Left Sinus: Maxillary sinus tenderness and frontal sinus tenderness present.     Mouth/Throat:     Pharynx: Uvula midline.  Neck:     Musculoskeletal: Normal range of motion and neck supple.     Thyroid: No thyromegaly.  Cardiovascular:     Rate and Rhythm: Normal rate and regular rhythm.     Heart sounds: Normal heart sounds.  Pulmonary:     Effort: Pulmonary effort is normal. No respiratory distress.     Breath sounds: Normal breath sounds. No stridor. No wheezing, rhonchi or rales.  Lymphadenopathy:     Cervical: No cervical adenopathy.  Neurological:     Mental Status: She is alert.      UC Treatments / Results  Labs (all labs ordered are listed, but only abnormal results are displayed) Labs Reviewed - No data to display  EKG None  Radiology No results found.  Procedures Procedures (including critical care time)  Medications Ordered in UC Medications - No data to display  Initial Impression / Assessment and Plan / UC Course  I have reviewed the triage vital signs and the nursing notes.  Pertinent labs & imaging results that were available during my care of the patient were reviewed by me and considered in my medical decision making (see chart for details).      Final Clinical Impressions(s) / UC Diagnoses   Final diagnoses:  Influenza-like illness    ED Prescriptions    Medication Sig Dispense Auth. Provider   oseltamivir (TAMIFLU) 75 MG capsule Take 1 capsule (75 mg total) by mouth 2 (two) times daily. 10 capsule Payton Mccallum, MD   ondansetron (ZOFRAN ODT) 8 MG disintegrating tablet Take 1 tablet (8 mg total) by mouth every 8 (eight) hours as needed. 6 tablet Payton Mccallum, MD      1. diagnosis reviewed with patient 2. rx as per orders above; reviewed possible side effects, interactions, risks and benefits  3. Recommend supportive treatment with rest, fluids, otc analgesics prn 4. Follow-up prn  if symptoms worsen or don't improve  Controlled Substance Prescriptions [place] Controlled Substance Registry consulted? Not Applicable   Payton Mccallum, MD 11/15/18 [phone]

## 2018-11-15 NOTE — ED Triage Notes (Signed)
Patient in today c/o headache, body aches and chills since yesterday and emesis started this afternoon ~3pm. Patient has used OTC Tylenol sinus.

## 2019-12-09 ENCOUNTER — Other Ambulatory Visit: Payer: Self-pay

## 2019-12-09 ENCOUNTER — Ambulatory Visit
Admission: EM | Admit: 2019-12-09 | Discharge: 2019-12-09 | Disposition: A | Discharge status: Home / Self Care | Payer: BC Managed Care – PPO | Attending: Family Medicine | Admitting: Family Medicine

## 2019-12-09 ENCOUNTER — Ambulatory Visit (INDEPENDENT_AMBULATORY_CARE_PROVIDER_SITE_OTHER)
Admit: 2019-12-09 | Discharge: 2019-12-09 | Disposition: A | Discharge status: Home / Self Care | Payer: BC Managed Care – PPO | Attending: Family Medicine | Admitting: Family Medicine

## 2019-12-09 DIAGNOSIS — R1032 Left lower quadrant pain: Secondary | ICD-10-CM | POA: Diagnosis not present

## 2019-12-09 HISTORY — DX: Irritable bowel syndrome, unspecified: K58.9

## 2019-12-09 LAB — URINALYSIS, COMPLETE (UACMP) WITH MICROSCOPIC
Bacteria, UA: NONE SEEN
Bilirubin Urine: NEGATIVE
Glucose, UA: NEGATIVE mg/dL
Hgb urine dipstick: NEGATIVE
Ketones, ur: NEGATIVE mg/dL
Leukocytes,Ua: NEGATIVE
Nitrite: NEGATIVE
Protein, ur: NEGATIVE mg/dL
RBC / HPF: NONE SEEN RBC/hpf (ref 0–5)
Specific Gravity, Urine: 1.02 (ref 1.005–1.030)
WBC, UA: NONE SEEN WBC/hpf (ref 0–5)
pH: 7 (ref 5.0–8.0)

## 2019-12-09 LAB — COMPREHENSIVE METABOLIC PANEL
ALT: 38 U/L (ref 0–44)
AST: 45 U/L — ABNORMAL HIGH (ref 15–41)
Albumin: 4 g/dL (ref 3.5–5.0)
Alkaline Phosphatase: 59 U/L (ref 38–126)
Anion gap: 11 (ref 5–15)
BUN: 10 mg/dL (ref 6–20)
CO2: 21 mmol/L — ABNORMAL LOW (ref 22–32)
Calcium: 8.5 mg/dL — ABNORMAL LOW (ref 8.9–10.3)
Chloride: 103 mmol/L (ref 98–111)
Creatinine, Ser: 0.58 mg/dL (ref 0.44–1.00)
GFR calc Af Amer: >60 mL/min (ref >60)
GFR calc non Af Amer: >60 mL/min (ref >60)
Glucose, Bld: 97 mg/dL (ref 70–99)
Potassium: 3.4 mmol/L — ABNORMAL LOW (ref 3.5–5.1)
Sodium: 135 mmol/L (ref 135–145)
Total Bilirubin: 0.4 mg/dL (ref 0.3–1.2)
Total Protein: 6.2 g/dL — ABNORMAL LOW (ref 6.5–8.1)

## 2019-12-09 LAB — CBC WITH DIFFERENTIAL/PLATELET
Abs Immature Granulocytes: 0.02 10*3/uL (ref 0.00–0.07)
Basophils Absolute: 0 10*3/uL (ref 0.0–0.1)
Basophils Relative: 0 %
Eosinophils Absolute: 0 10*3/uL (ref 0.0–0.5)
Eosinophils Relative: 1 %
HCT: 36.4 % (ref 36.0–46.0)
Hemoglobin: 12.2 g/dL (ref 12.0–15.0)
Immature Granulocytes: 0 %
Lymphocytes Relative: 33 %
Lymphs Abs: 2 10*3/uL (ref 0.7–4.0)
MCH: 30.7 pg (ref 26.0–34.0)
MCHC: 33.5 g/dL (ref 30.0–36.0)
MCV: 91.7 fL (ref 80.0–100.0)
Monocytes Absolute: 0.5 10*3/uL (ref 0.1–1.0)
Monocytes Relative: 8 %
Neutro Abs: 3.6 10*3/uL (ref 1.7–7.7)
Neutrophils Relative %: 58 %
Platelets: 228 10*3/uL (ref 150–400)
RBC: 3.97 MIL/uL (ref 3.87–5.11)
RDW: 12.7 % (ref 11.5–15.5)
WBC: 6.2 10*3/uL (ref 4.0–10.5)
nRBC: 0 % (ref 0.0–0.2)

## 2019-12-09 LAB — LIPASE, BLOOD: Lipase: 26 U/L (ref 11–51)

## 2019-12-09 MED ORDER — HYDROCODONE-ACETAMINOPHEN 5-325 MG PO TABS: 1.0000 | ORAL_TABLET | Freq: Three times a day (TID) | ORAL | 0 refills | Status: DC | PRN

## 2019-12-09 NOTE — ED Triage Notes (Signed)
Pt presents with c/o lower abdominal pain, left side, that started yesterday. Pt states the pain does radiate to her lower back. She reports pain with walking, sitting. She denies any urinary/bowel problems, n/v/d or other symptoms. Pt states she does have previous dx of IBS, but states this pain is different. She states it feels more like severe menstrual cramps. Pt has had partial hysterectomy.

## 2019-12-09 NOTE — ED Provider Notes (Signed)
[facility]    CSN: [id number] Arrival date & time: 12/09/19  1621  History   Chief Complaint Chief Complaint  Patient presents with  . Abdominal Pain   HPI  44 year old female presents with abdominal pain.  Patient reports that her abdominal pain started yesterday.  She reports acute left lower abdominal pain as well as low back pain.  Pain intermittent.  Worse with activity.  Better with lying down and certain positions. Has known IBS. No urinary symptoms.  Pain 7/10 in severity.  No fever.  No other associated symptoms.  No other complaints.  Past Medical History:  Diagnosis Date  . Arthritis   . GERD (gastroesophageal reflux disease)   . IBS (irritable bowel syndrome)    Patient Active Problem List   Diagnosis Date Noted  . Cellulitis of labia 09/17/2015  . Cellulitis 09/16/2015   Past Surgical History:  Procedure Laterality Date  . ABDOMINAL HYSTERECTOMY    . PARTIAL HYSTERECTOMY    . TUBAL LIGATION     OB History   No obstetric history on file.    Home Medications    Prior to Admission medications   Medication Sig Start Date End Date Taking? Authorizing Provider  methotrexate 250 MG/10ML injection Inject 0.6 mLs into the muscle once a week. 03/15/17  Yes [provider]  HYDROcodone-acetaminophen (NORCO/VICODIN) 5-325 MG tablet Take 1 tablet by mouth every 8 (eight) hours as needed. 12/09/19   Coral Spikes, DO    Family History Family History  Problem Relation Age of Onset  . Diabetes Father   . Stroke Father   . Heart disease Sister   . Stroke Sister   . Cancer Maternal Grandmother   . Cancer Maternal Grandfather   . Heart disease Paternal Grandmother   . Depression Mother     Social History Social History   Tobacco Use  . Smoking status: Never Smoker  . Smokeless tobacco: Never Used  Substance Use Topics  . Alcohol use: Yes    Comment: occassional  . Drug use: No     Allergies   Vancomycin and Penicillins    Review of Systems Review of Systems  Constitutional: Negative.   Gastrointestinal: Positive for abdominal pain.  Genitourinary: Negative.    Physical Exam Triage Vital Signs ED Triage Vitals  Enc Vitals Group     BP 12/09/19 1642 119/63     Pulse Rate 12/09/19 1642 75     Resp 12/09/19 1642 18     Temp 12/09/19 1642 98.3 F (36.8 C)     Temp Source 12/09/19 1642 Oral     SpO2 12/09/19 1642 100 %     Weight 12/09/19 1640 117 lb (53.1 kg)     Height 12/09/19 1640 5\' 5"  (1.651 m)     Head Circumference --      Peak Flow --      Pain Score 12/09/19 1639 7     Pain Loc --      Pain Edu? --      Excl. in [place]? --    Updated Vital Signs BP 119/63 (BP Location: Left Arm)   Pulse 75   Temp 98.3 F (36.8 C) (Oral)   Resp 18   Ht 5\' 5"  (1.651 m)   Wt 53.1 kg   SpO2 100%   BMI 19.47 kg/m   Visual Acuity Right Eye Distance:   Left Eye Distance:   Bilateral Distance:    Right Eye Near:   Left Eye  Near:    Bilateral Near:     Physical Exam Vitals and nursing note reviewed.  Constitutional:      General: She is not in acute distress.    Appearance: She is well-developed.     Comments: Patient no acute distress but appears in pain.  HENT:     Head: Normocephalic and atraumatic.  Eyes:     General:        Right eye: No discharge.        Left eye: No discharge.     Conjunctiva/sclera: Conjunctivae normal.  Cardiovascular:     Rate and Rhythm: Normal rate and regular rhythm.     Heart sounds: No murmur.  Pulmonary:     Effort: Pulmonary effort is normal.     Breath sounds: Normal breath sounds. No wheezing, rhonchi or rales.  Abdominal:     General: There is no distension.     Palpations: Abdomen is soft.     Tenderness: There is no guarding or rebound.     Comments: Exquisitely tender to palpation of left lower quadrant.  Neurological:     Mental Status: She is alert.  Psychiatric:        Mood and Affect: Mood normal.        Behavior: Behavior normal.    UC  Treatments / Results  Labs (all labs ordered are listed, but only abnormal results are displayed) Labs Reviewed  COMPREHENSIVE METABOLIC PANEL - Abnormal; Notable for the following components:      Result Value   Potassium 3.4 (*)    CO2 21 (*)    Calcium 8.5 (*)    Total Protein 6.2 (*)    AST 45 (*)    All other components within normal limits  URINALYSIS, COMPLETE (UACMP) WITH MICROSCOPIC  CBC WITH DIFFERENTIAL/PLATELET  LIPASE, BLOOD    EKG   Radiology CT ABDOMEN PELVIS WO CONTRAST  Result Date: 12/09/2019 CLINICAL DATA:  Left lower quadrant pain EXAM: CT ABDOMEN AND PELVIS WITHOUT CONTRAST TECHNIQUE: Multidetector CT imaging of the abdomen and pelvis was performed following the standard protocol without oral or IV contrast. COMPARISON:  None. FINDINGS: Lower chest: Lung bases are clear. Hepatobiliary: No focal liver lesions are evident on this noncontrast enhanced study. Gallbladder wall is not appreciably thickened. There is no biliary duct dilatation. Pancreas: There is no evident pancreatic mass or inflammatory focus. Spleen: No splenic lesions are evident. Adrenals/Urinary Tract: Adrenals bilaterally appear normal. Kidneys bilaterally show no evident mass or hydronephrosis on either side. There is no renal or ureteral calculus on either side. Urinary bladder is midline with wall thickness within normal limits. Stomach/Bowel: There is moderate stool throughout the colon. There is no appreciable bowel wall or mesenteric thickening. No appreciable diverticular disease. There is no evident bowel obstruction. Terminal ileum appears unremarkable. There is no appreciable free air or portal venous air. Vascular/Lymphatic: There is no abdominal aortic aneurysm. No vascular lesions are evident on this noncontrast enhanced study. There is no evident adenopathy in the abdomen or pelvis. Reproductive: Uterus absent.  No evident pelvic mass. Other: Appendix appears normal. No evident abscess or  ascites in the abdomen or pelvis. Musculoskeletal: No blastic or lytic bone lesions. No intramuscular or abdominal wall lesions evident. IMPRESSION: 1. A cause for patient's symptoms has not been established with this study. 2. No appreciable diverticular disease. No bowel wall thickening or bowel obstruction. No abscess in the abdomen or pelvis. Appendix appears normal. 3. No evident renal or ureteral calculus.  No hydronephrosis. Urinary bladder wall thickness within normal limits. Electronically Signed   By: Bretta Bang III M.D.   On: 12/09/2019 17:27    Procedures Procedures (including critical care time)  Medications Ordered in UC Medications - No data to display  Initial Impression / Assessment and Plan / UC Course  I have reviewed the triage vital signs and the nursing notes.  Pertinent labs & imaging results that were available during my care of the patient were reviewed by me and considered in my medical decision making (see chart for details).    44 year old female presents with left lower quadrant pain.  Given presentation, concern for diverticulitis versus kidney stone.  Laboratory studies obtained and were essentially unremarkable.  Given the severity of her pain and acute onset, CT abdomen and pelvis was obtained and revealed no apparent cause for the patient's symptoms.  No diverticular disease, no bowel obstruction, no abscess or infection.  No kidney stone/calculus.  Etiology and prognosis unclear at this time.  Treating pain with hydrocodone.  Supportive care.  [place] controlled substance database reviewed.  Final Clinical Impressions(s) / UC Diagnoses   Final diagnoses:  LLQ abdominal pain     Discharge Instructions     CT and labs negative.  Medication as needed.  Take care  Dr. Adriana Simas    ED Prescriptions    Medication Sig Dispense Auth. Provider   HYDROcodone-acetaminophen (NORCO/VICODIN) 5-325 MG tablet Take 1 tablet by mouth every 8 (eight) hours  as needed. 10 tablet Everlene Other G, DO     I have reviewed the PDMP during this encounter.   Tommie Sams, [place] 12/09/19 Silva Bandy

## 2019-12-09 NOTE — Discharge Instructions (Signed)
CT and labs negative.  Medication as needed.  Take care  Dr. Adriana Simas

## 2019-12-10 ENCOUNTER — Telehealth: Payer: Self-pay | Admitting: Emergency Medicine

## 2019-12-10 NOTE — Telephone Encounter (Signed)
PA# for CT scan [id number] Spoke with Martie Lee in pre cert and gave her the PA#.

## 2019-12-28 ENCOUNTER — Encounter: Payer: Self-pay | Admitting: Emergency Medicine

## 2019-12-28 ENCOUNTER — Ambulatory Visit
Admission: EM | Admit: 2019-12-28 | Discharge: 2019-12-28 | Disposition: A | Discharge status: Home / Self Care | Payer: BC Managed Care – PPO | Attending: Emergency Medicine | Admitting: Emergency Medicine

## 2019-12-28 ENCOUNTER — Other Ambulatory Visit: Payer: Self-pay

## 2019-12-28 DIAGNOSIS — Z881 Allergy status to other antibiotic agents status: Secondary | ICD-10-CM | POA: Insufficient documentation

## 2019-12-28 DIAGNOSIS — Z8249 Family history of ischemic heart disease and other diseases of the circulatory system: Secondary | ICD-10-CM | POA: Diagnosis not present

## 2019-12-28 DIAGNOSIS — Z833 Family history of diabetes mellitus: Secondary | ICD-10-CM | POA: Diagnosis not present

## 2019-12-28 DIAGNOSIS — Z823 Family history of stroke: Secondary | ICD-10-CM | POA: Diagnosis not present

## 2019-12-28 DIAGNOSIS — Z880 Allergy status to penicillin: Secondary | ICD-10-CM | POA: Diagnosis not present

## 2019-12-28 DIAGNOSIS — H6982 Other specified disorders of Eustachian tube, left ear: Secondary | ICD-10-CM | POA: Diagnosis not present

## 2019-12-28 DIAGNOSIS — Z20822 Contact with and (suspected) exposure to covid-19: Secondary | ICD-10-CM | POA: Insufficient documentation

## 2019-12-28 DIAGNOSIS — R519 Headache, unspecified: Secondary | ICD-10-CM | POA: Diagnosis present

## 2019-12-28 MED ORDER — FLUTICASONE PROPIONATE 50 MCG/ACT NA SUSP: 2.0000 | Freq: Every day | NASAL | 0 refills | Status: AC

## 2019-12-28 NOTE — ED Provider Notes (Signed)
[facility]    CSN: [id number] Arrival date & time: 12/28/19  1426      History   Chief Complaint Chief Complaint  Patient presents with  . Headache    HPI Amy Kirby is a 44 y.o. female.   HPI  44 year old female presents with headaches that started on Thursday after lunch and it recurred again on Friday , Saturday and today.  Affects mostly the crown of her head.  It does not last long and seems to be coming and going.  Also complaining of pain and fullness in her left ear.  This extends down into the throat area.  Had some dizziness that is very short-lived.  He has a feeling of movement but has never fallen or had any near syncope or syncope.  She denies any neurological signs or symptoms such as numbness or tingling weakness aphasia blurring or loss of vision.  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  He denies any fever.  She has felt chilly but her temperature is never gotten above 99.6.           Past Medical History:  Diagnosis Date  . Arthritis   . GERD (gastroesophageal reflux disease)   . IBS (irritable bowel syndrome)     Patient Active Problem List   Diagnosis Date Noted  . Cellulitis of labia 09/17/2015  . Cellulitis 09/16/2015    Past Surgical History:  Procedure Laterality Date  . ABDOMINAL HYSTERECTOMY    . PARTIAL HYSTERECTOMY    . TUBAL LIGATION      OB History   No obstetric history on file.      Home Medications    Prior to Admission medications   Medication Sig Start Date End Date Taking? Authorizing Provider  fluticasone (FLONASE) 50 MCG/ACT nasal spray Place 2 sprays into both nostrils daily. 12/28/19   Lorin Picket, PA-C    Family History Family History  Problem Relation Age of Onset  . Diabetes Father   . Stroke Father   . Heart disease Sister   . Stroke Sister   . Cancer Maternal Grandmother   . Cancer Maternal Grandfather   . Heart disease Paternal Grandmother   .  Depression Mother     Social History Social History   Tobacco Use  . Smoking status: Never Smoker  . Smokeless tobacco: Never Used  Substance Use Topics  . Alcohol use: Yes    Comment: occassional  . Drug use: No     Allergies   Vancomycin and Penicillins   Review of Systems Review of Systems  Constitutional: Positive for activity change and chills. Negative for appetite change, diaphoresis, fatigue and fever.  HENT: Positive for ear pain, sinus pressure and sinus pain.   Respiratory: Negative for cough, shortness of breath, wheezing and stridor.   Neurological: Positive for dizziness and headaches.  All other systems reviewed and are negative.    Physical Exam Triage Vital Signs ED Triage Vitals  Enc Vitals Group     BP 12/28/19 1522 103/69     Pulse Rate 12/28/19 1522 70     Resp 12/28/19 1522 14     Temp 12/28/19 1522 98.2 F (36.8 C)     Temp Source 12/28/19 1522 Oral     SpO2 12/28/19 1522 100 %     Weight 12/28/19 1518 117 lb (53.1 kg)     Height 12/28/19 1518 5' 5.5" (1.664 m)     Head Circumference --  Peak Flow --      Pain Score 12/28/19 1518 0     Pain Loc --      Pain Edu? --      Excl. in [place]? --    No data found.  Updated Vital Signs BP 103/69 (BP Location: Right Arm)   Pulse 70   Temp 98.2 F (36.8 C) (Oral)   Resp 14   Ht 5' 5.5" (1.664 m)   Wt 117 lb (53.1 kg)   SpO2 100%   BMI 19.17 kg/m   Visual Acuity Right Eye Distance:   Left Eye Distance:   Bilateral Distance:    Right Eye Near:   Left Eye Near:    Bilateral Near:     Physical Exam Vitals and nursing note reviewed.  Constitutional:      General: She is not in acute distress.    Appearance: She is well-developed and normal weight. She is not ill-appearing or toxic-appearing.  HENT:     Head: Normocephalic and atraumatic.  Eyes:     Extraocular Movements: Extraocular movements intact.     Right eye: Normal extraocular motion and no nystagmus.     Left eye:  Normal extraocular motion and no nystagmus.     Pupils: Pupils are equal, round, and reactive to light.  Cardiovascular:     Rate and Rhythm: Normal rate and regular rhythm.     Heart sounds: Normal heart sounds.  Pulmonary:     Effort: Pulmonary effort is normal.     Breath sounds: Normal breath sounds.  Musculoskeletal:        General: Normal range of motion.     Cervical back: Normal range of motion and neck supple.  Skin:    General: Skin is warm and dry.  Neurological:     Mental Status: She is alert and oriented to person, place, and time.     Cranial Nerves: No cranial nerve deficit, dysarthria or facial asymmetry.     Sensory: No sensory deficit.     Motor: No weakness.     Coordination: Coordination normal.     Gait: Gait normal.  Psychiatric:        Mood and Affect: Mood normal.        Speech: Speech normal.        Behavior: Behavior normal.      UC Treatments / Results  Labs (all labs ordered are listed, but only abnormal results are displayed) Labs Reviewed  SARS CORONAVIRUS 2 (TAT 6-24 HRS)    EKG   Radiology No results found.  Procedures Procedures (including critical care time)  Medications Ordered in UC Medications - No data to display  Initial Impression / Assessment and Plan / UC Course  I have reviewed the triage vital signs and the nursing notes.  Pertinent labs & imaging results that were available during my care of the patient were reviewed by me and considered in my medical decision making (see chart for details).   44 year old female presents with headache and dizziness that started on Thursday after lunch occurred again Friday after lunch and is continued off and on Saturday and today.  It seems to be decreasing.  Her dizziness is of usual short duration feels as if things are moving around her.  She states that she felt she was going to fall forward but never did fall has had no near syncope or syncope.  She did not have any neurological  signs or symptoms.  He had no  nausea or vomiting.  He had no cold symptoms to speak of.  He does complain of left ear fullness and pressure that extends into her throat area.  She had no fever or chills.  Examination was reassuring.  There was no neurological findings that were focal today.  Ears were normal TMs and canals.  She currently does not have a headache.  She was tested for SARS Covid 19.  Will be available in 12 to 24 hours.  I placed her on Flonase nasal spray for left eustachian tube dysfunction.  She should use this 2 to 4 weeks.  If it is not improving she will follow-up with her primary care physician Dr. Judithann Graves.  If she worsens she should go to the emergency room. Final Clinical Impressions(s) / UC Diagnoses   Final diagnoses:  Eustachian tube dysfunction, left     Discharge Instructions     Continue using the Flonase nasal spray for 2 to 4 weeks.  If the symptoms worsen I recommend seeing your primary care physician.   ED Prescriptions    Medication Sig Dispense Auth. Provider   fluticasone (FLONASE) 50 MCG/ACT nasal spray Place 2 sprays into both nostrils daily. 16 g Lutricia Feil, PA-C     PDMP not reviewed this encounter.   Lutricia Feil, PA-C 12/28/19 1635

## 2019-12-28 NOTE — Discharge Instructions (Addendum)
Continue using the Flonase nasal spray for 2 to 4 weeks.  If the symptoms worsen I recommend seeing your primary care physician.

## 2019-12-28 NOTE — ED Triage Notes (Addendum)
Patient c/o HA that started on Friday with some dizziness.  Patient states that she is not having any pain in her head at this time.  Patient states her pain comes and goes. Patient states that she has had no dizziness.  Patient denies any other cold symptoms.  Patient denies fevers.

## 2019-12-29 LAB — SARS CORONAVIRUS 2 (TAT 6-24 HRS): SARS Coronavirus 2: NEGATIVE

## 2021-11-05 IMAGING — CT CT ABD-PELV W/O CM
1 of 2 series · 15 of 32 positions shown, 19 images · non-contrast
Comparison: None.

CLINICAL DATA: Left lower quadrant pain

EXAM:
CT ABDOMEN AND PELVIS WITHOUT CONTRAST
TECHNIQUE: Multidetector CT imaging of the abdomen and pelvis was performed
following the standard protocol without oral or IV contrast.

[Series 2: axial st · axial · 0.63mm/px · z∈[-904,-538]mm · 15 of 81 slices shown, 19 images]
[im 4/81  soft-tissue]
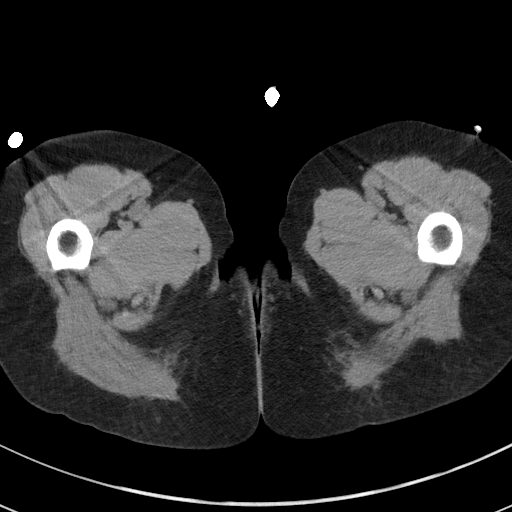
[im 4/81  bone]
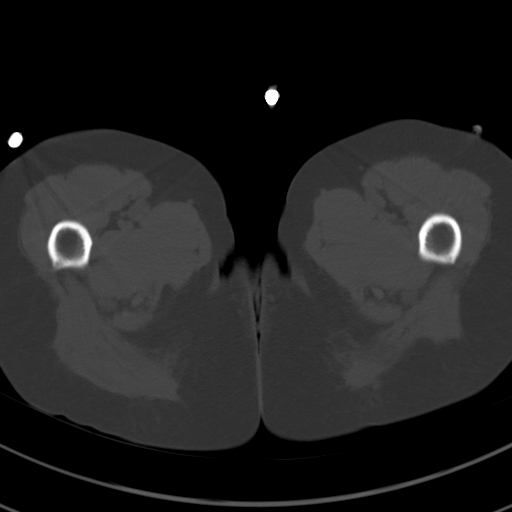
[im 11/81  soft-tissue]
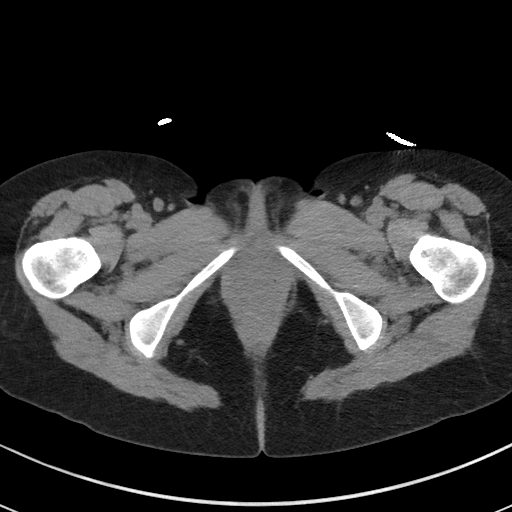
[im 17/81  soft-tissue]
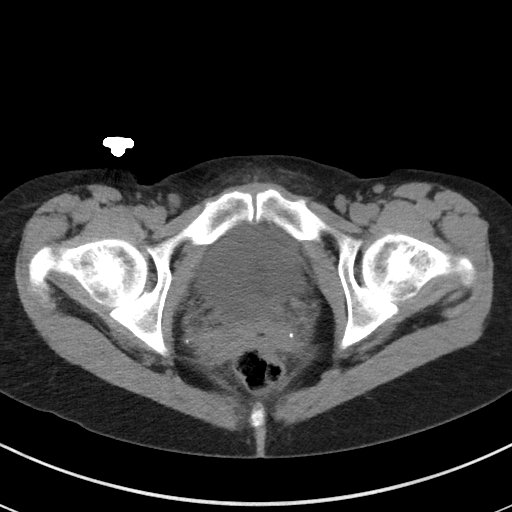
[im 24/81  soft-tissue]
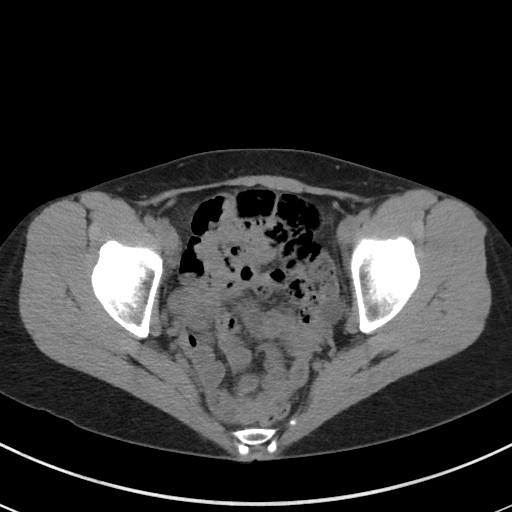
[im 27/81  soft-tissue]
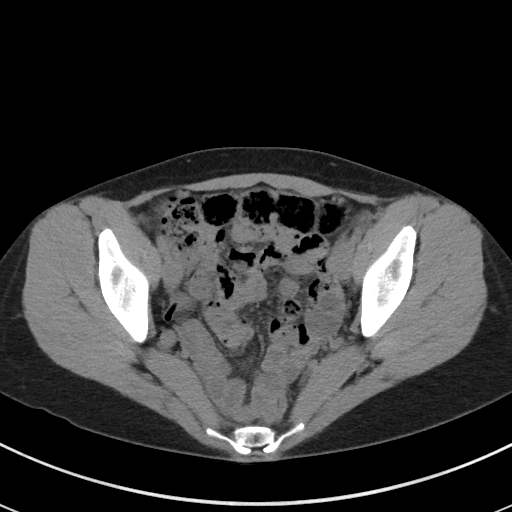
[im 34/81  soft-tissue]
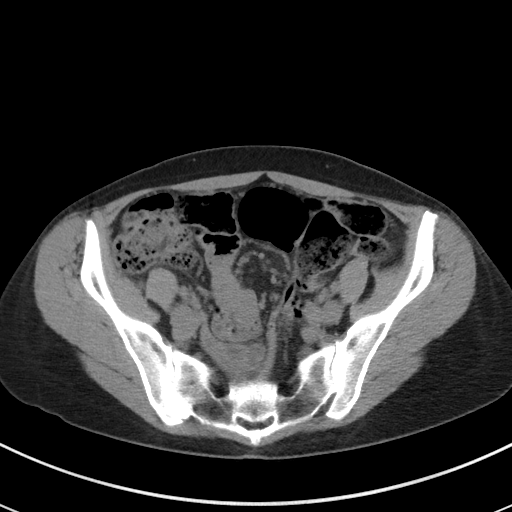
[im 41/81  soft-tissue]
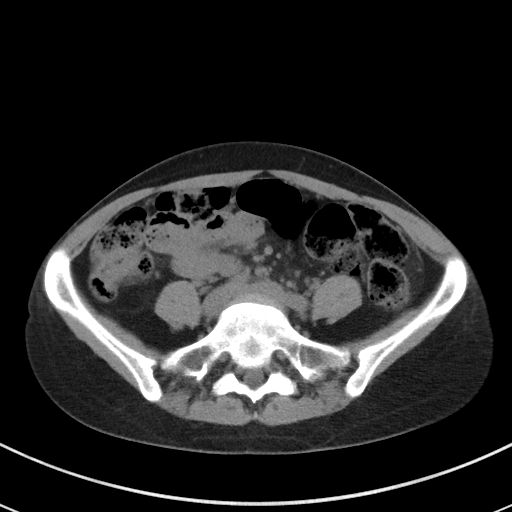
[im 47/81  soft-tissue]
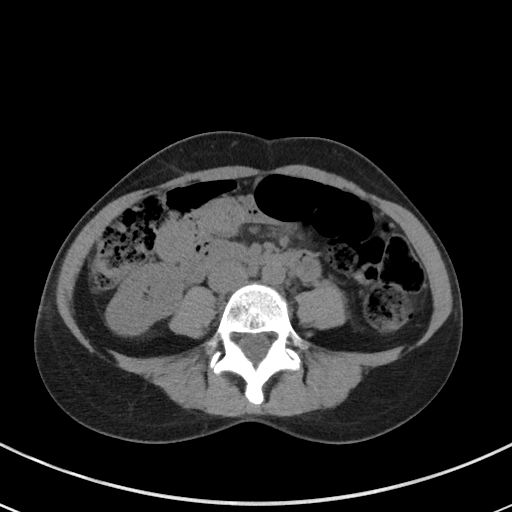
[im 54/81  soft-tissue]
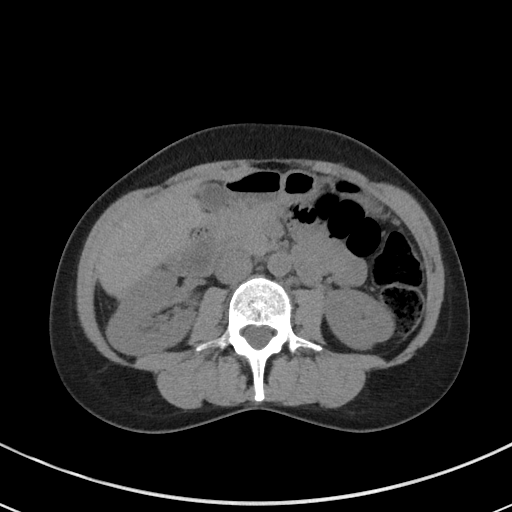
[im 54/81  bone]
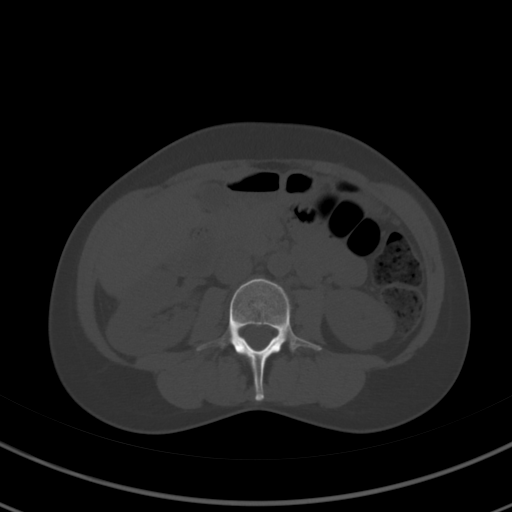
[im 57/81  soft-tissue]
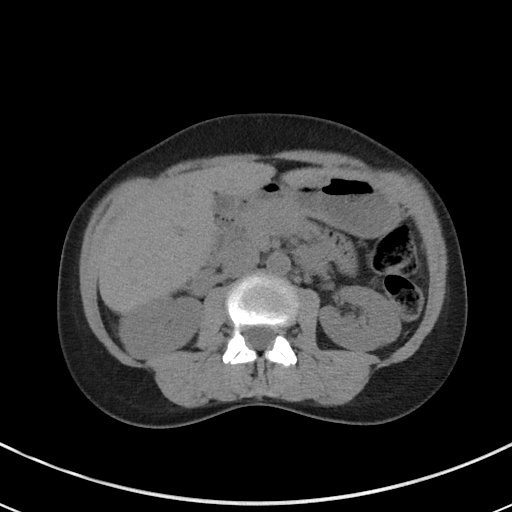
[im 64/81  soft-tissue]
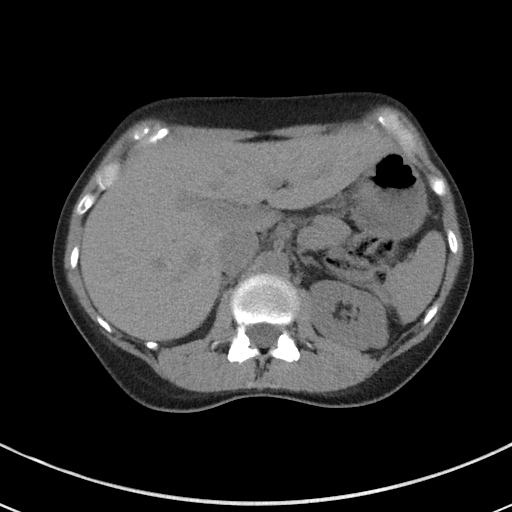
[im 67/81  lung]
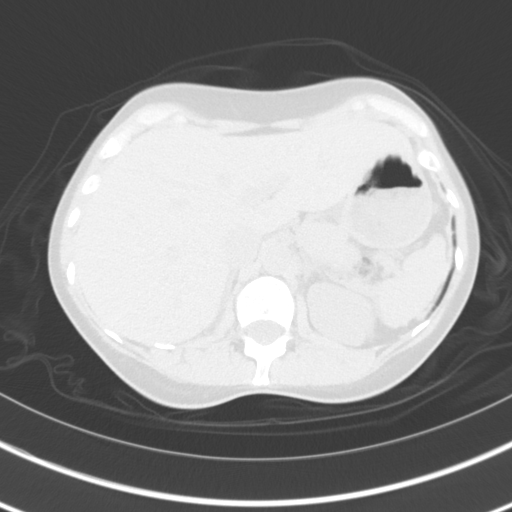
[im 71/81  soft-tissue]
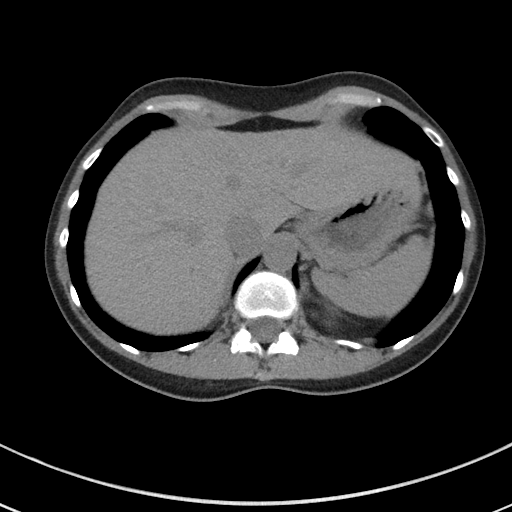
[im 71/81  lung]
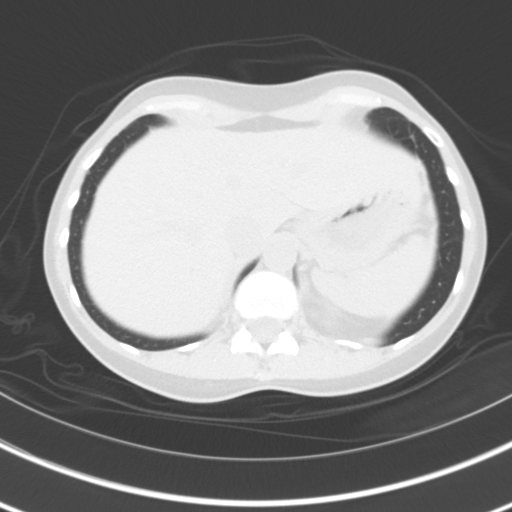
[im 74/81  lung]
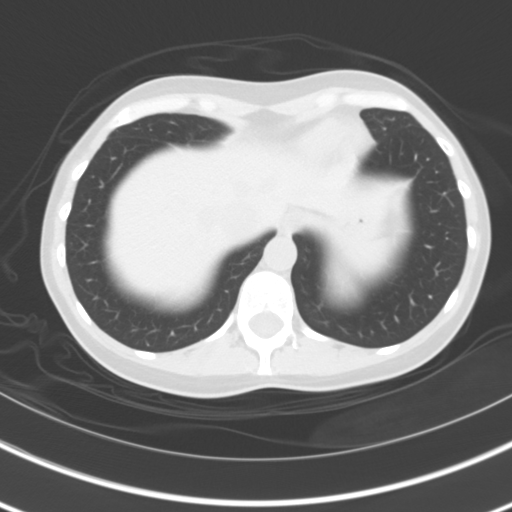
[im 77/81  soft-tissue]
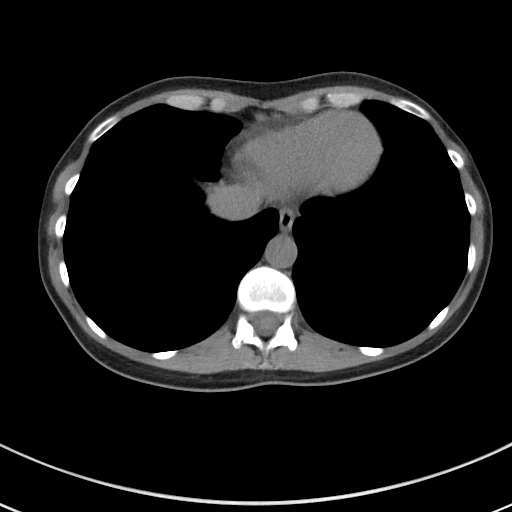
[im 77/81  lung]
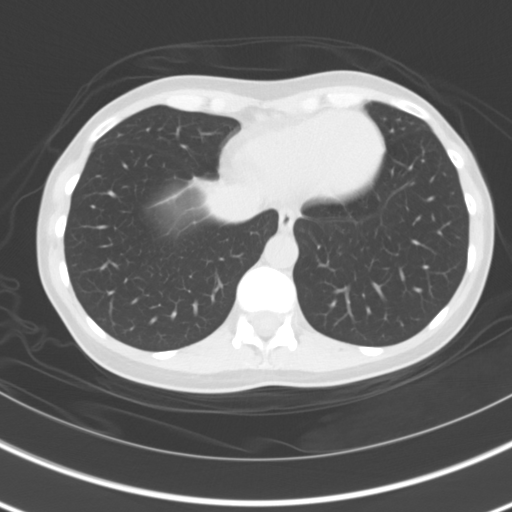

[15 of 32 positions shown; findings below may reference images not displayed]

FINDINGS: Lower chest: Lung bases are clear.

Hepatobiliary: No focal liver lesions are evident on this
noncontrast enhanced study. Gallbladder wall is not appreciably
thickened. There is no biliary duct dilatation.

Pancreas: There is no evident pancreatic mass or inflammatory focus.

Spleen: No splenic lesions are evident.

Adrenals/Urinary Tract: Adrenals bilaterally appear normal. Kidneys
bilaterally show no evident mass or hydronephrosis on either side.
There is no renal or ureteral calculus on either side. Urinary
bladder is midline with wall thickness within normal limits.

Stomach/Bowel: There is moderate stool throughout the colon. There
is no appreciable bowel wall or mesenteric thickening. No
appreciable diverticular disease. There is no evident bowel
obstruction. Terminal ileum appears unremarkable. There is no
appreciable free air or portal venous air.

Vascular/Lymphatic: There is no abdominal aortic aneurysm. No
vascular lesions are evident on this noncontrast enhanced study.
There is no evident adenopathy in the abdomen or pelvis.

Reproductive: Uterus absent.  No evident pelvic mass.

Other: Appendix appears normal. No evident abscess or ascites in the
abdomen or pelvis.

Musculoskeletal: No blastic or lytic bone lesions. No intramuscular
or abdominal wall lesions evident.
IMPRESSION: 1. A cause for patient's symptoms has not been established with this
study.

2. No appreciable diverticular disease. No bowel wall thickening or
bowel obstruction. No abscess in the abdomen or pelvis. Appendix
appears normal.

3. No evident renal or ureteral calculus. No hydronephrosis. Urinary
bladder wall thickness within normal limits.
